# Patient Record
Sex: Male | Born: 1940 | Race: White | Hispanic: No | Marital: Single | Smoking: Never smoker
Health system: Southern US, Community
[De-identification: ages and names within clinical notes are randomized; demographics above are authoritative.]

## PROBLEM LIST (undated history)

## (undated) DIAGNOSIS — I252 Old myocardial infarction: Secondary | ICD-10-CM

## (undated) DIAGNOSIS — H409 Unspecified glaucoma: Secondary | ICD-10-CM

## (undated) DIAGNOSIS — M5136 Other intervertebral disc degeneration, lumbar region: Secondary | ICD-10-CM

## (undated) DIAGNOSIS — M545 Low back pain, unspecified: Secondary | ICD-10-CM

## (undated) DIAGNOSIS — M51369 Other intervertebral disc degeneration, lumbar region without mention of lumbar back pain or lower extremity pain: Secondary | ICD-10-CM

## (undated) DIAGNOSIS — M5030 Other cervical disc degeneration, unspecified cervical region: Secondary | ICD-10-CM

## (undated) DIAGNOSIS — H532 Diplopia: Secondary | ICD-10-CM

## (undated) DIAGNOSIS — I219 Acute myocardial infarction, unspecified: Secondary | ICD-10-CM

## (undated) DIAGNOSIS — H02409 Unspecified ptosis of unspecified eyelid: Secondary | ICD-10-CM

## (undated) DIAGNOSIS — IMO0002 Reserved for concepts with insufficient information to code with codable children: Secondary | ICD-10-CM

## (undated) DIAGNOSIS — E7800 Pure hypercholesterolemia, unspecified: Secondary | ICD-10-CM

## (undated) HISTORY — DX: Other intervertebral disc degeneration, lumbar region without mention of lumbar back pain or lower extremity pain: M51.369

## (undated) HISTORY — DX: Acute myocardial infarction, unspecified: I21.9

## (undated) HISTORY — DX: Other intervertebral disc degeneration, lumbar region: M51.36

## (undated) HISTORY — DX: Low back pain, unspecified: M54.50

## (undated) HISTORY — DX: Pure hypercholesterolemia, unspecified: E78.00

## (undated) HISTORY — DX: Reserved for concepts with insufficient information to code with codable children: IMO0002

## (undated) HISTORY — DX: Unspecified ptosis of unspecified eyelid: H02.409

## (undated) HISTORY — DX: Unspecified glaucoma: H40.9

## (undated) HISTORY — DX: Other cervical disc degeneration, unspecified cervical region: M50.30

## (undated) HISTORY — DX: Diplopia: H53.2

## (undated) HISTORY — DX: Old myocardial infarction: I25.2

## (undated) HISTORY — PX: CYSTECTOMY: SUR359

## (undated) HISTORY — DX: Low back pain: M54.5

---

## 2002-06-16 ENCOUNTER — Inpatient Hospital Stay (HOSPITAL_COMMUNITY): Admission: AD | Admit: 2002-06-16 | Discharge: 2002-06-18 | Payer: Self-pay | Admitting: Family Medicine

## 2002-07-24 ENCOUNTER — Ambulatory Visit (HOSPITAL_COMMUNITY): Admission: RE | Admit: 2002-07-24 | Discharge: 2002-07-25 | Payer: Self-pay | Admitting: Cardiology

## 2004-07-25 ENCOUNTER — Ambulatory Visit (HOSPITAL_COMMUNITY): Admission: RE | Admit: 2004-07-25 | Discharge: 2004-07-25 | Payer: Self-pay | Admitting: Family Medicine

## 2006-05-21 IMAGING — CR DG CERVICAL SPINE COMPLETE 4+V
5 series · 5 of 5 positions shown · non-contrast
Comparison: none

CLINICAL DATA: Neck pain radiating to right shoulder.
 CERVICAL SPINE ? 5 VIEW:

[w c-spine lat (1 of 3)]
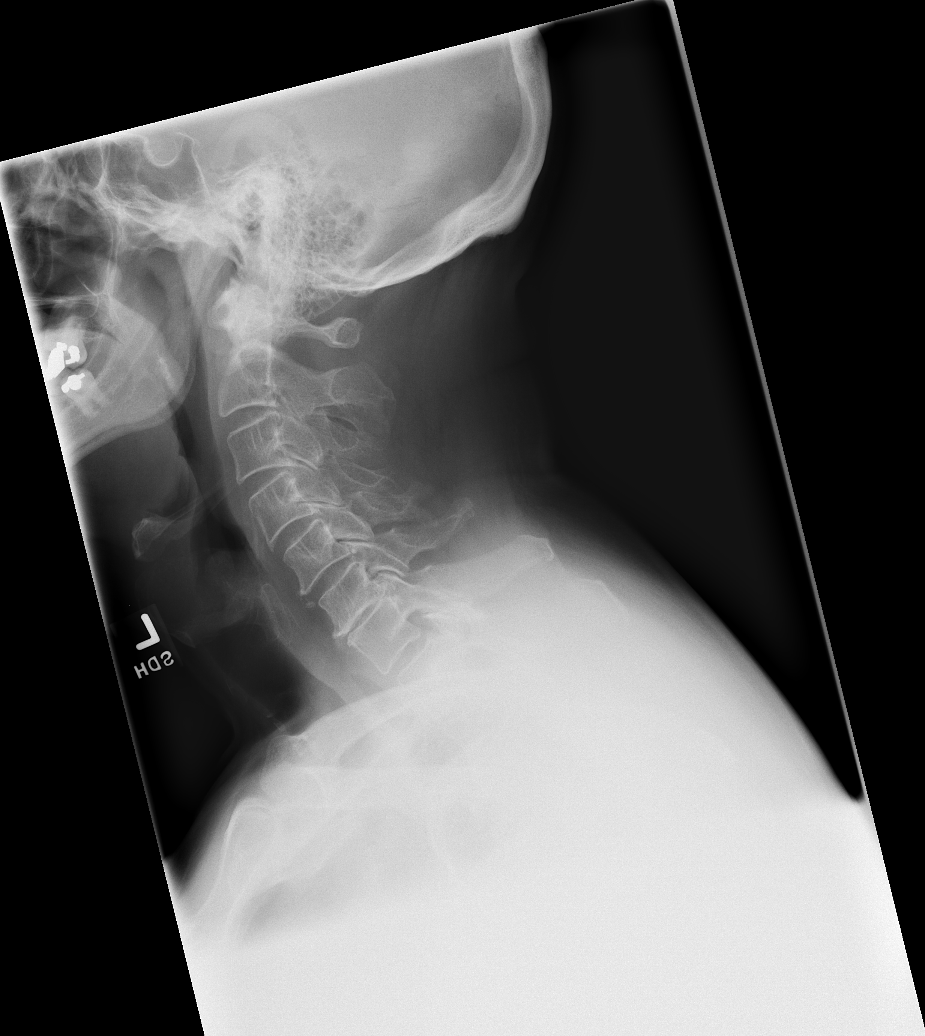

[w c-spine lat (2 of 3)]
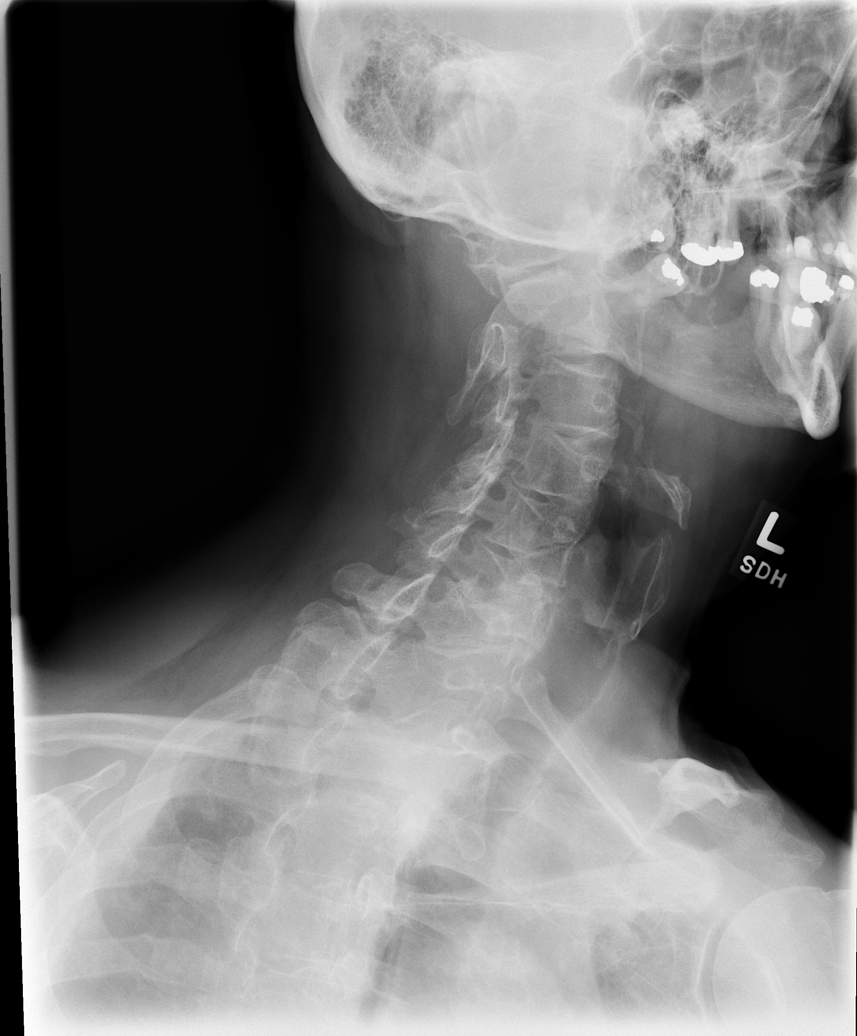

[w c-spine lat (3 of 3)]
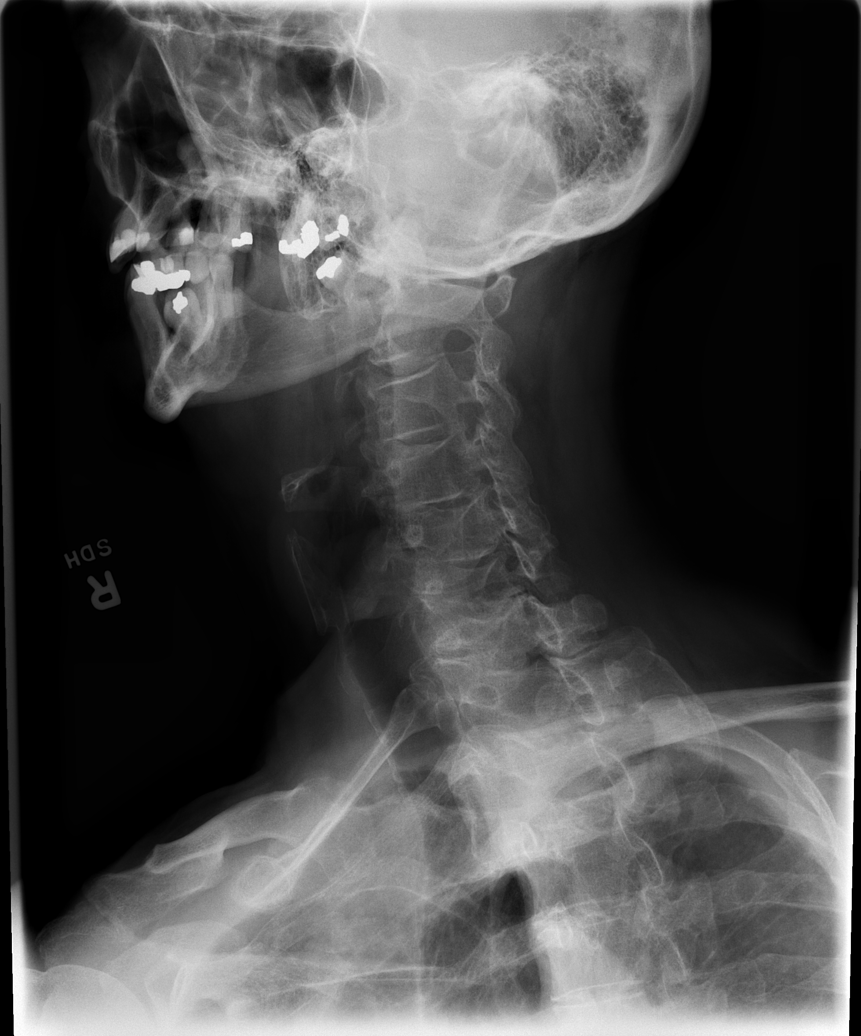

[w c-spine a.p. * (1 of 2)]
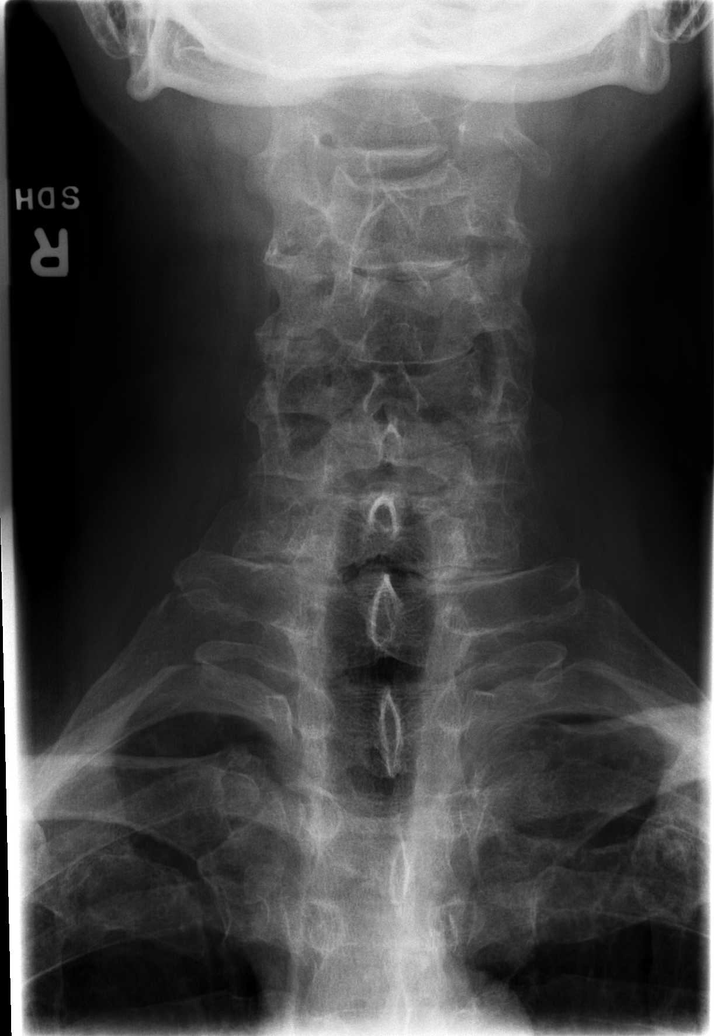

[w c-spine a.p. * (2 of 2)]
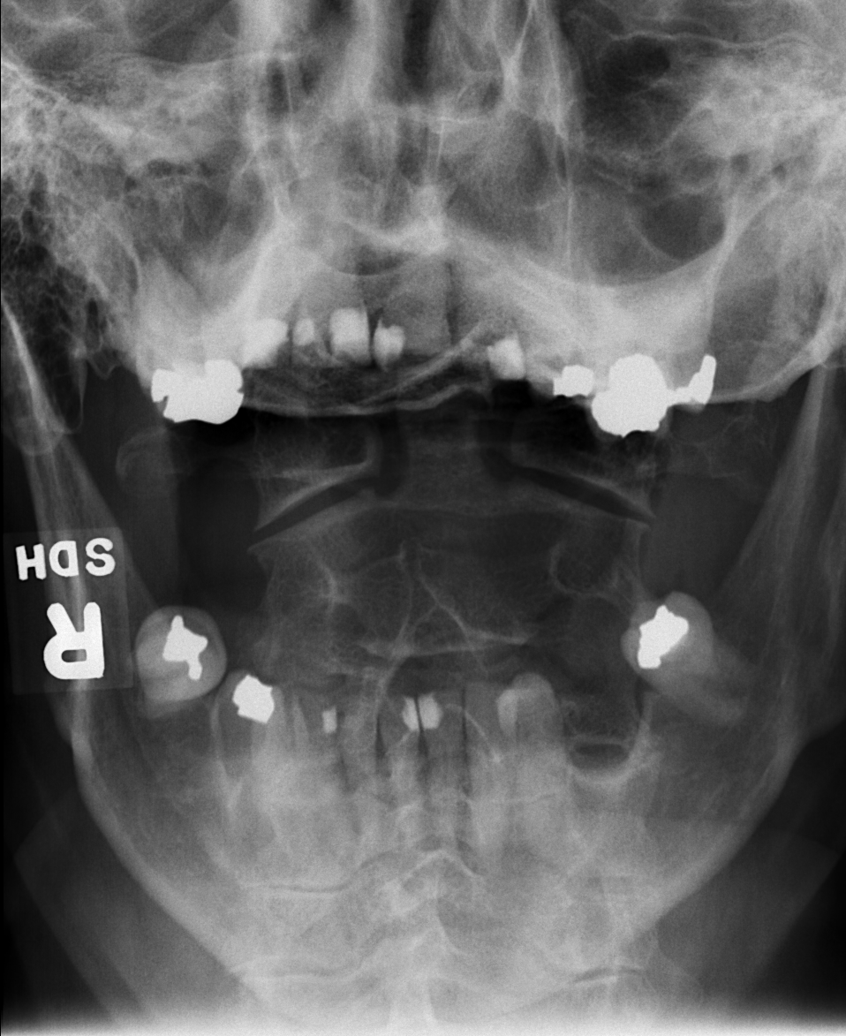

[5 of 5 positions shown; findings below may reference images not displayed]

FINDINGS: Normal cervical alignment is noted.  No evidence of fracture or prevertebral soft tissue swelling.  Degenerative disk disease and spondylosis are noted throughout the cervical spine, severe at C6-7.  Bony foraminal narrowing bilaterally is identified from C3 to C7 bilaterally.
IMPRESSION: Degenerative disk disease and spondylosis of the cervical spine, most severe at C6-7, with bony foraminal narrowing.

## 2008-06-19 ENCOUNTER — Encounter: Admission: RE | Admit: 2008-06-19 | Discharge: 2008-06-19 | Payer: Self-pay | Admitting: Family Medicine

## 2010-05-01 ENCOUNTER — Encounter: Payer: Self-pay | Admitting: Family Medicine

## 2010-08-26 NOTE — H&P (Signed)
NAMEJATHEN, [name] NO.:  [id number]   MEDICAL RECORD NO.:  [id number]                   PATIENT TYPE:  INP   LOCATION:  2037                                 FACILITY:  [facility]   PHYSICIAN:  [name], M.D.               DATE OF BIRTH:  1941/01/22   DATE OF ADMISSION:  06/16/2002  DATE OF DISCHARGE:                                HISTORY & PHYSICAL   PRIMARY CARE [name]:  Dr. Stacie Acres. Holder.   IMPRESSION:  (As dictated by [name].)  Chest pain with atypical/typical features of unstable angina pectoris, rule  out gastrointestinal versus cardiac.  Cardiac risk factors include age and  positive family history of coronary artery disease.  Unknown cholesterol  profile.  His first electrocardiogram is nonischemic (from prior medical  doctor's office).  He continues with ongoing chest discomfort.  No  significant change in discomfort after gastrointestinal cocktail at primary  medical doctor clinic, nor after serial sublingual nitrates here in  hospital.  Discomfort does not appear to be muscular nor pleuritic component  to pain.   PLAN:  (As dictated by [name].)  1. Repeat EKG.  2. Serial cardiac enzymes/EKG.  3. Aspirin daily; we will give him four babys to chew right know.  We will     start IV heparin and IV nitroglycerin.  4. If rules in, would counsel for cardiac catheterization.  The patient is     very resistant to any idea of invasive procedures.  With this in mind, we     will obtain a 2-D echo to help guide our decision/recommendations.   HISTORY OF PRESENT ILLNESS:  The patient is a very pleasant 70 year old  gentleman with positive family history of CAD.  The patient had an episode  of transient mid-substernal chest discomfort with associated shortness of  breath when walking briskly after one-quarter of a mile on Saturday.  This  was relieved with rest.  Last evening, he developed left anterior chest  discomfort after coughing after eating some rice yesterday.  The  discomfort persisted and though it waxed and waned in intensity, it never  resolved.  He has had mild nausea/chills.  No diaphoresis nor shortness of  breath.  The patient took an Alka-Seltzer Plus and Advil yesterday without  change.   PREVIOUS MEDICAL HISTORY:  Gout flare:  Denies history of diabetes,  hypertension, ulcers, cancer, nor thyroid disease.   PREVIOUS SURGICAL HISTORY:  Removal of a cyst on his hip.   SOCIAL HISTORY AND HABITS:  Tobacco/ETOH:  Negative.  He has been married  for 41 years.  He has two daughters.  He works as a Secretary/administrator at [employer].  [employer].   FAMILY HISTORY:  Father died at age 33 of CHF.  Mother died at age 29,  uncertain cause.  One brother had a  valve placed at age 30s,  a redo valve  with CABG, age 25; he died at age 77.  Two sisters alive and well.   REVIEW OF SYSTEMS:  As in HPI/previous medical history, otherwise, denies  problems with lightheadedness, dizziness, syncope nor near-syncopal  episodes.  Gets choked up after eating certain foods such as corn bread and  rice.  GI/GU:  Negative.  Minor hearing problems.  No pedal edema, orthopnea  or PND.  The patient must pace himself so as to not get winded.  He  attributes this to his weight of approximately 200 pounds and feels this  should be more like 170.  He seems more easily fatigued/short of breath with  exertion since he suffered heat exhaustion episode in 1999.   The patient has been under a lot of stress over last few weeks secondary to  birth of twin grandchildren, one of whom needed advanced medical support.  He also has had more job stress at work the last few weeks.   PHYSICAL EXAMINATION:  (As performed by [name].)  VITAL SIGNS:  Blood pressure 160/98, heart rate 104 and regular, respiratory  rate 16.  He is 5 feet 8 inches and weighs 200.8 pounds.  GENERAL:  In general, he is a well-nourished, pleasantly  conversant  gentleman appearing younger than his 61 years.  His wife is in attendance.  HEENT:  Brisk bilateral carotid upstrokes without bruit.  No significant JVD  nor thyromegaly.  CHEST:  Lung sounds clear with equal bilateral excursion.  Negative CVA  tenderness.  CARDIAC:  Regular rate and rhythm without murmur or rub.  Positive S4.  ABDOMEN:  Soft, nondistended, normoactive bowel sounds.  Negative abdominal  aortic, renal nor femoral bruit.  Nontender to applied pressure.  No masses  nor organomegaly appreciated.  EXTREMITIES:  Distal pulses intact.  Negative pedal edema.  GENITAL AND RECTAL:  Exams deferred.   LABORATORY TESTS AND DATA:  CBC, CMET, cardiac enzymes and coagulations were  drawn and are pending.   EKG from primary M.D. office revealed NSR without ischemic changes.   Quick-look 2-D echo revealed normal LV function without regional wall motion  abnormalities.  Valves okay.   ADDENDUM:  The patient had emesis and states he feels better after this.      [name], N.P.                       [name], M.D.    MES/MEDQ  D:  06/16/2002  T:  06/16/2002  Job:  [id number]

## 2012-01-15 DIAGNOSIS — H532 Diplopia: Secondary | ICD-10-CM | POA: Insufficient documentation

## 2012-01-15 DIAGNOSIS — IMO0002 Reserved for concepts with insufficient information to code with codable children: Secondary | ICD-10-CM | POA: Insufficient documentation

## 2012-01-31 DIAGNOSIS — H5000 Unspecified esotropia: Secondary | ICD-10-CM | POA: Insufficient documentation

## 2012-01-31 DIAGNOSIS — H5030 Unspecified intermittent heterotropia: Secondary | ICD-10-CM | POA: Insufficient documentation

## 2012-12-16 DIAGNOSIS — H401190 Primary open-angle glaucoma, unspecified eye, stage unspecified: Secondary | ICD-10-CM | POA: Insufficient documentation

## 2013-01-04 ENCOUNTER — Other Ambulatory Visit: Payer: Self-pay | Admitting: Cardiology

## 2013-01-04 DIAGNOSIS — Z79899 Other long term (current) drug therapy: Secondary | ICD-10-CM

## 2013-01-04 DIAGNOSIS — E782 Mixed hyperlipidemia: Secondary | ICD-10-CM

## 2013-02-07 ENCOUNTER — Other Ambulatory Visit (INDEPENDENT_AMBULATORY_CARE_PROVIDER_SITE_OTHER): Payer: Medicare Other

## 2013-02-07 DIAGNOSIS — Z79899 Other long term (current) drug therapy: Secondary | ICD-10-CM

## 2013-02-07 DIAGNOSIS — E782 Mixed hyperlipidemia: Secondary | ICD-10-CM

## 2013-02-07 LAB — ALT: ALT: 29 U/L (ref 0–53)

## 2013-02-09 LAB — NMR LIPOPROFILE WITH LIPIDS
HDL Size: 9.1 nm — ABNORMAL LOW (ref >=9.2)
HDL-C: 49 mg/dL (ref >=40)
LDL Size: 20.3 nm — ABNORMAL LOW (ref >20.5)
LP-IR Score: 44 (ref <=45)
Large HDL-P: 5.3 umol/L (ref >=4.8)
Small LDL Particle Number: 522 nmol/L (ref <=527)
VLDL Size: 43.3 nm (ref <=46.6)

## 2013-02-11 ENCOUNTER — Telehealth: Payer: Self-pay | Admitting: Pharmacist

## 2013-02-11 DIAGNOSIS — Z79899 Other long term (current) drug therapy: Secondary | ICD-10-CM

## 2013-02-11 DIAGNOSIS — E785 Hyperlipidemia, unspecified: Secondary | ICD-10-CM

## 2013-02-11 NOTE — Telephone Encounter (Signed)
RF:  CAD (MI 2004), age - LDL goal < 100, LDL-P < 1000 nmol/L Meds:  Vytorin 10/40 mg qd, fish oil 4 g/d. Patient changed from Crestor to Vytorin in past for potency reasons. Excellent panel today.    Patient notified to continue same regimen and recheck lipid / liver panel in 1 year.  Appointment made for 01/21/14.  Results mailed to patient.

## 2013-02-15 ENCOUNTER — Encounter: Payer: Self-pay | Admitting: Cardiology

## 2013-02-19 ENCOUNTER — Ambulatory Visit (INDEPENDENT_AMBULATORY_CARE_PROVIDER_SITE_OTHER): Payer: Medicare Other | Admitting: Cardiology

## 2013-02-19 ENCOUNTER — Encounter: Payer: Self-pay | Admitting: Cardiology

## 2013-02-19 VITALS — BP 158/86 | HR 69 | Ht 69.5 in | Wt 209.0 lb

## 2013-02-19 DIAGNOSIS — I208 Other forms of angina pectoris: Secondary | ICD-10-CM

## 2013-02-19 DIAGNOSIS — I252 Old myocardial infarction: Secondary | ICD-10-CM

## 2013-02-19 DIAGNOSIS — N529 Male erectile dysfunction, unspecified: Secondary | ICD-10-CM

## 2013-02-19 DIAGNOSIS — I2510 Atherosclerotic heart disease of native coronary artery without angina pectoris: Secondary | ICD-10-CM

## 2013-02-19 DIAGNOSIS — E7800 Pure hypercholesterolemia, unspecified: Secondary | ICD-10-CM

## 2013-02-19 HISTORY — DX: Old myocardial infarction: I25.2

## 2013-02-19 NOTE — Progress Notes (Signed)
[address] [place], [place]  [address] Phone: [phone] Fax:  [phone]  Date:  02/19/2013   ID:  [name], DOB 1940-11-07, MRN [id number]  PCP:  Ryan Patella, MD   History of Present Illness: [name] is a 72 y.o. male with coronary artery disease status post stent to RCA in 2004, Taxus 3.0 x 12 mm here for followup. Had terrible pain in central chest prior to stent. Initial myocardial infarction included anterolateral region with occluded diagonal. One month later he had RCA stent.   CAD-doing well, no exertional angina. Occasionally will have mild shortness of breath. Sometimes with yard work. No change in symptoms. it has been a while since last stress test.  Hyperlipidemia-on Vytorin, fish oil. Doing well. Benicol. Plant stanol. doing great  Enjoying fishing at [facility]. Sometimes feels decreased exercise tolerance. Conditioning.  Wt Readings from Last 3 Encounters:  02/19/13 209 lb (94.802 kg)     Past Medical History  Diagnosis Date  . Myocardial infarction   . Degeneration, intervertebral disc, cervical   . Disc degeneration, lumbar   . Hypercholesterolemia   . Glaucoma   . Vertical diplopia   . Ptosis   . Hypertropia right  . Low back pain   . 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 One month later, RCA PCI.    History reviewed. No pertinent past surgical history.  Current Outpatient Prescriptions  Medication Sig Dispense Refill  . aspirin 81 MG tablet Take 81 mg by mouth daily.      . dorzolamide-timolol (COSOPT) 22.3-6.8 MG/ML ophthalmic solution Place 1 drop into the right eye 2 (two) times daily.      Marland Kitchen ezetimibe-simvastatin (VYTORIN) 10-40 MG per tablet Take 1 tablet by mouth daily.      . fish oil-omega-3 fatty acids 1000 MG capsule Take 2 g by mouth daily.      . fluticasone (CUTIVATE) 0.005 % ointment Apply 1 application topically 2 (two)  times daily.      Marland Kitchen ibuprofen (ADVIL,MOTRIN) 200 MG tablet Take 200 mg by mouth every 6 (six) hours as needed.      Marland Kitchen levothyroxine (SYNTHROID, LEVOTHROID) 50 MCG tablet Take 50 mcg by mouth daily before breakfast.       No current facility-administered medications for this visit.    Allergies:   No Known Allergies  Social History:  The patient  reports that he has never smoked. He does not have any smokeless tobacco history on file. He reports that he does not drink alcohol or use illicit drugs.   ROS:  Please see the history of present illness.   Denies any fevers, chills, orthopnea, PND, syncope    PHYSICAL EXAM: VS:  BP 158/86  Pulse 69  Ht 5' 9.5" (1.765 m)  Wt 209 lb (94.802 kg)  BMI 30.43 kg/m2 Well nourished, well developed, in no acute distress HEENT: normal Neck: no JVD Cardiac:  normal S1, S2; RRR; no murmur Lungs:  clear to auscultation bilaterally, no wheezing, rhonchi or rales Abd: soft, nontender, no hepatomegaly Ext: no edema Skin: warm and dry Neuro: no focal abnormalities noted  EKG:  Normal rhythm, 69, minimal voltage criteria for LVH. No ST segment changes.     ASSESSMENT AND PLAN:  1. Old myocardial infarction-currently doing well. No issues. 2. Angina-currently stable. No recent history. Continue with secondary prevention. Prior percutaneous intervention. Encouraged conditioning. Weight loss 3. Coronary  artery disease-as described above. PCI to RCA. 4. Hyperlipidemia-no changes made. Vytorin. NMR profile reviewed. 02/07/13-LDL particle #951, small LDL 522. We will repeat in 1 year, normal lipid profile 5. Erectile dysfunction-currently being treated medically. Getting a mixture pharmacy that is working. Viagra was too expensive.  Signed, Ryan Schultz, MD [facility]  02/19/2013 10:58 AM

## 2013-02-19 NOTE — Patient Instructions (Signed)
Your physician recommends that you continue on your current medications as directed. Please refer to the Current Medication list given to you today.  Your physician wants you to follow-up in: 1 year with Dr. Skains. You will receive a reminder letter in the mail two months in advance. If you don't receive a letter, please call our office to schedule the follow-up appointment.  

## 2013-07-03 ENCOUNTER — Other Ambulatory Visit: Payer: Self-pay | Admitting: Cardiology

## 2014-01-05 ENCOUNTER — Other Ambulatory Visit: Payer: Self-pay | Admitting: Cardiology

## 2014-01-06 ENCOUNTER — Other Ambulatory Visit: Payer: Self-pay

## 2014-01-06 MED ORDER — EZETIMIBE-SIMVASTATIN 10-40 MG PO TABS: ORAL_TABLET | ORAL | Status: DC

## 2014-01-21 ENCOUNTER — Other Ambulatory Visit (INDEPENDENT_AMBULATORY_CARE_PROVIDER_SITE_OTHER): Payer: Medicare Other | Admitting: *Deleted

## 2014-01-21 DIAGNOSIS — E785 Hyperlipidemia, unspecified: Secondary | ICD-10-CM

## 2014-01-21 DIAGNOSIS — Z79899 Other long term (current) drug therapy: Secondary | ICD-10-CM

## 2014-01-21 LAB — LIPID PANEL
CHOL/HDL RATIO: 3
Cholesterol: 129 mg/dL (ref 0–200)
HDL: 38 mg/dL — AB (ref >39.00)
LDL Cholesterol: 69 mg/dL (ref 0–99)
NONHDL: 91
TRIGLYCERIDES: 110 mg/dL (ref 0.0–149.0)
VLDL: 22 mg/dL (ref 0.0–40.0)

## 2014-01-21 LAB — HEPATIC FUNCTION PANEL
ALT: 29 U/L (ref 0–53)
AST: 26 U/L (ref 0–37)
Albumin: 3.6 g/dL (ref 3.5–5.2)
Alkaline Phosphatase: 41 U/L (ref 39–117)
BILIRUBIN TOTAL: 0.8 mg/dL (ref 0.2–1.2)
Bilirubin, Direct: 0.1 mg/dL (ref 0.0–0.3)
Total Protein: 7.6 g/dL (ref 6.0–8.3)

## 2014-02-03 ENCOUNTER — Telehealth: Payer: Self-pay

## 2014-02-03 ENCOUNTER — Other Ambulatory Visit: Payer: Self-pay | Admitting: Cardiology

## 2014-02-03 ENCOUNTER — Other Ambulatory Visit: Payer: Self-pay

## 2014-02-03 MED ORDER — NITROGLYCERIN 0.4 MG SL SUBL: 0.4000 mg | SUBLINGUAL_TABLET | SUBLINGUAL | Status: DC | PRN

## 2014-02-03 NOTE — Telephone Encounter (Signed)
OK to refill - he has an appt in Nov with Dr Anne FuSkains.

## 2014-02-03 NOTE — Telephone Encounter (Signed)
I'll mail his lab results

## 2014-02-19 ENCOUNTER — Ambulatory Visit (INDEPENDENT_AMBULATORY_CARE_PROVIDER_SITE_OTHER): Payer: Medicare Other | Admitting: Cardiology

## 2014-02-19 ENCOUNTER — Encounter: Payer: Self-pay | Admitting: Cardiology

## 2014-02-19 VITALS — BP 130/82 | HR 67 | Ht 69.5 in | Wt 211.0 lb

## 2014-02-19 DIAGNOSIS — E7800 Pure hypercholesterolemia, unspecified: Secondary | ICD-10-CM

## 2014-02-19 DIAGNOSIS — N5201 Erectile dysfunction due to arterial insufficiency: Secondary | ICD-10-CM

## 2014-02-19 DIAGNOSIS — I252 Old myocardial infarction: Secondary | ICD-10-CM

## 2014-02-19 DIAGNOSIS — I208 Other forms of angina pectoris: Secondary | ICD-10-CM

## 2014-02-19 DIAGNOSIS — I2510 Atherosclerotic heart disease of native coronary artery without angina pectoris: Secondary | ICD-10-CM

## 2014-02-19 DIAGNOSIS — E669 Obesity, unspecified: Secondary | ICD-10-CM | POA: Insufficient documentation

## 2014-02-19 NOTE — Progress Notes (Signed)
[address] [place], [place]  [address] Phone: [phone]([phone] Fax:  [phone](336) [phone]  Date:  02/19/2014   ID:  [name] Selner, DOB 1940/08/11, MRN [id number]  PCP:  Lolita PatellaEADE,ROBERT ALEXANDER, MD   History of Present Illness: [name] Kelly is a 73 y.o. male with coronary artery disease status post stent to RCA in 2004, Taxus 3.0 x 12 mm here for followup. Had terrible pain in central chest prior to stent. Initial myocardial infarction included anterolateral region with occluded diagonal. One month later he had RCA stent.   CAD-doing well, no exertional angina. Occasionally will have mild shortness of breath. Sometimes with yard work. No change in symptoms. it has been a while since last stress test.  Hyperlipidemia-on Vytorin, fish oil. Doing well. Benicol. Plant stanol. doing great.  Dizziness in AM. Room spinning. BP normal. Goes away in 24 hr. Laying back makes it worse.   [place]. Sometimes feels decreased exercise tolerance. Conditioning. Was frustrated previously with our process at the front desk, lab work. We discussed.  Wt Readings from Last 3 Encounters:  02/19/14 211 lb (95.709 kg)  02/19/13 209 lb (94.802 kg)     Past Medical History  Diagnosis Date  . Myocardial infarction   . Degeneration, intervertebral disc, cervical   . Disc degeneration, lumbar   . Hypercholesterolemia   . Glaucoma   . Vertical diplopia   . Ptosis   . Hypertropia right  . Low back pain   . 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 One month later, RCA PCI.    No past surgical history on file.  Current Outpatient Prescriptions  Medication Sig Dispense Refill  . aspirin 81 MG tablet Take 81 mg by mouth daily.    . dorzolamide-timolol (COSOPT) 22.3-6.8 MG/ML ophthalmic solution Place 1 drop into the right eye 2 (two) times daily.    Marland Kitchen. ezetimibe-simvastatin (VYTORIN) 10-40 MG per tablet TAKE  ONE (1) TABLET BY MOUTH EVERY DAY 30 tablet 5  . fish oil-omega-3 fatty acids 1000 MG capsule Take 2 g by mouth daily.    . fluticasone (CUTIVATE) 0.005 % ointment Apply 1 application topically 2 (two) times daily.    Marland Kitchen. ibuprofen (ADVIL,MOTRIN) 200 MG tablet Take 200 mg by mouth every 6 (six) hours as needed.    Marland Kitchen. levothyroxine (SYNTHROID, LEVOTHROID) 50 MCG tablet Take 50 mcg by mouth daily before breakfast.    . LUMIGAN 0.01 % SOLN   4  . nitroGLYCERIN (NITROSTAT) 0.4 MG SL tablet Place 1 tablet (0.4 mg total) under the tongue every 5 (five) minutes as needed for chest pain. 90 tablet 0   No current facility-administered medications for this visit.    Allergies:   No Known Allergies  Social History:  The patient  reports that he has never smoked. He does not have any smokeless tobacco history on file. He reports that he does not drink alcohol or use illicit drugs.   ROS:  Please see the history of present illness.   Denies any fevers, chills, orthopnea, PND, syncope    PHYSICAL EXAM: VS:  BP 130/82 mmHg  Pulse 67  Ht 5' 9.5" (1.765 m)  Wt 211 lb (95.709 kg)  BMI 30.72 kg/m2 Well nourished, well developed, in no acute distress HEENT: normal Neck: no JVD Cardiac:  normal S1, S2; RRR; no murmur Lungs:  clear to auscultation bilaterally, no wheezing, rhonchi or rales Abd:  soft, nontender, no hepatomegaly Ext: no edema Skin: warm and dry Neuro: no focal abnormalities noted  EKG:  02/19/14-sinus rhythm, 67 bpm with no other abnormalities. Previously-Normal rhythm, 69, minimal voltage criteria for LVH. No ST segment changes.  Labs:01/21/14- LDL 69   ASSESSMENT AND PLAN:  1. Old myocardial infarction-currently doing well. No issues. 2. Angina-currently stable. No recent history. Continue with secondary prevention. Prior percutaneous intervention. Encouraged conditioning. Weight loss. No changes.  3. Coronary artery disease-as described above. PCI to RCA. 4. Hyperlipidemia-no  changes made. Vytorin. NMR profile reviewed. 02/07/13-LDL particle #951, small LDL 522. We will repeat in 1 year, normal lipid profile. Excellent control with Vytorin. This is the first year however he is in the donut hole. He is frustrated with this. Last LDL 69. Excellent. 5. Erectile dysfunction-currently being treated medically. Getting a mixture pharmacy that is working. Viagra was too expensive. 6. obesity-continue to work on weight loss.  Signed, Donato SchultzMark Veldon Wager, MD [facility]  02/19/2014 9:36 AM

## 2014-02-19 NOTE — Patient Instructions (Addendum)
Your physician wants you to follow-up in: 12 months with Dr. Algis LimingSkains You will receive a reminder letter in the mail two months in advance. If you don't receive a letter, please call our office to schedule the follow-up appointment.  Your physician recommends that you return for lab work prior to apt. Call office prior to appointment  Will need fasting lipid and liver

## 2014-07-17 ENCOUNTER — Other Ambulatory Visit: Payer: Self-pay | Admitting: Cardiology

## 2015-01-19 ENCOUNTER — Other Ambulatory Visit: Payer: Self-pay | Admitting: Cardiology

## 2015-03-19 ENCOUNTER — Telehealth: Payer: Self-pay | Admitting: Cardiology

## 2015-03-19 NOTE — Telephone Encounter (Signed)
Returned call to patient Wanting lab results Not released by MD yet.

## 2015-03-19 NOTE — Telephone Encounter (Signed)
Follow Up  Pt called. Please place orders.

## 2015-03-22 ENCOUNTER — Other Ambulatory Visit (INDEPENDENT_AMBULATORY_CARE_PROVIDER_SITE_OTHER): Payer: Medicare Other | Admitting: *Deleted

## 2015-03-22 DIAGNOSIS — E7800 Pure hypercholesterolemia, unspecified: Secondary | ICD-10-CM | POA: Diagnosis not present

## 2015-03-22 LAB — LIPID PANEL
CHOL/HDL RATIO: 2.9 ratio (ref <=5.0)
CHOLESTEROL: 126 mg/dL (ref 125–200)
HDL: 43 mg/dL (ref >=40)
LDL CALC: 66 mg/dL (ref <130)
TRIGLYCERIDES: 87 mg/dL (ref <150)
VLDL: 17 mg/dL (ref <30)

## 2015-03-22 LAB — HEPATIC FUNCTION PANEL
ALBUMIN: 4 g/dL (ref 3.6–5.1)
ALT: 26 U/L (ref 9–46)
AST: 27 U/L (ref 10–35)
Alkaline Phosphatase: 41 U/L (ref 40–115)
Bilirubin, Direct: 0.1 mg/dL (ref <=0.2)
Indirect Bilirubin: 0.3 mg/dL (ref 0.2–1.2)
TOTAL PROTEIN: 7.2 g/dL (ref 6.1–8.1)
Total Bilirubin: 0.4 mg/dL (ref 0.2–1.2)

## 2015-03-23 ENCOUNTER — Other Ambulatory Visit: Payer: Self-pay | Admitting: Cardiology

## 2015-03-24 ENCOUNTER — Ambulatory Visit: Payer: Self-pay | Admitting: Cardiology

## 2015-04-06 ENCOUNTER — Telehealth: Payer: Self-pay

## 2015-04-06 NOTE — Telephone Encounter (Signed)
Staff message received that patient did not like the procedure for having labs drawn.  Writer contacted patient who verbalized discontent with "[facility] swallowing up everything."  He liked the process at [facility] better.  Writer allowed patient to express his frustration, which he did about various issues.   Pt also verbalized concerns about the parking situation at the hospital, and a family friend who may no longer have a job.  Writer listened, and thanked patient for his input.   Jim Likeeri Suits [facility] RN CCM

## 2015-04-22 ENCOUNTER — Ambulatory Visit (INDEPENDENT_AMBULATORY_CARE_PROVIDER_SITE_OTHER): Payer: Medicare Other | Admitting: Cardiology

## 2015-04-22 ENCOUNTER — Encounter: Payer: Self-pay | Admitting: Cardiology

## 2015-04-22 VITALS — BP 130/70 | HR 73 | Ht 69.0 in | Wt 217.8 lb

## 2015-04-22 DIAGNOSIS — I252 Old myocardial infarction: Secondary | ICD-10-CM | POA: Diagnosis not present

## 2015-04-22 DIAGNOSIS — Z79899 Other long term (current) drug therapy: Secondary | ICD-10-CM

## 2015-04-22 DIAGNOSIS — E7800 Pure hypercholesterolemia, unspecified: Secondary | ICD-10-CM | POA: Diagnosis not present

## 2015-04-22 DIAGNOSIS — I208 Other forms of angina pectoris: Secondary | ICD-10-CM | POA: Diagnosis not present

## 2015-04-22 DIAGNOSIS — I2510 Atherosclerotic heart disease of native coronary artery without angina pectoris: Secondary | ICD-10-CM

## 2015-04-22 MED ORDER — ATORVASTATIN CALCIUM 40 MG PO TABS: 40.0000 mg | ORAL_TABLET | Freq: Every day | ORAL | Status: DC

## 2015-04-22 NOTE — Progress Notes (Signed)
[address] [place], [place]  [address] Phone: [phone](336) [phone] Fax:  [phone](336) [phone]  Date:  04/22/2015   ID:  [name], DOB January 15, 1941, MRN [id number]  PCP:  Ryan PatellaEADE,Ryan ALEXANDER, MD   History of Present Illness: [name] is a 75 y.o. male with coronary artery disease status post stent to RCA in 2004, Taxus 3.0 x 12 mm here for followup. Had terrible pain in central chest prior to stent. Initial myocardial infarction included anterolateral region with occluded diagonal. One month later he had RCA stent.   CAD-doing well, no exertional angina. Occasionally will have mild shortness of breath. Sometimes with yard work. No change in symptoms. it has been a while since last stress test.  Hyperlipidemia-states that he is feeling sluggish on Vytorin, fish oil. Prior Benicol. Plant stanol. he states that he is talked to a lot of his friends who have had trouble with Vytorin. He would like to switch. Generic.   [place]. Sometimes feels decreased exercise tolerance. Conditioning.   Was frustrated previously with our process at the front desk, lab work. We discussed. He was also frustrated previously with the lab processes. [name]. He liked it at [facility] much better he states.   Wt Readings from Last 3 Encounters:  04/22/15 217 lb 12.8 oz (98.793 kg)  02/19/14 211 lb (95.709 kg)  02/19/13 209 lb (94.802 kg)     Past Medical History  Diagnosis Date  . Myocardial infarction (HCC)   . Degeneration, intervertebral disc, cervical   . Disc degeneration, lumbar   . Hypercholesterolemia   . Glaucoma   . Vertical diplopia   . Ptosis   . Hypertropia right  . Low back pain   . 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 One month later, RCA PCI.    No past surgical history on file.  Current Outpatient Prescriptions  Medication Sig Dispense Refill  . aspirin 81 MG tablet Take 81  mg by mouth daily.    . dorzolamide (TRUSOPT) 2 % ophthalmic solution Place 1 drop into both eyes 3 times daily.    . dorzolamide-timolol (COSOPT) 22.3-6.8 MG/ML ophthalmic solution Place 1 drop into the right eye 2 (two) times daily.    . fish oil-omega-3 fatty acids 1000 MG capsule Take 2 g by mouth daily.    . fluticasone (CUTIVATE) 0.005 % ointment Apply 1 application topically 2 (two) times daily.    Marland Kitchen. ibuprofen (ADVIL,MOTRIN) 200 MG tablet Take 200 mg by mouth every 6 (six) hours as needed.    . latanoprost (XALATAN) 0.005 % ophthalmic solution Place 1 drop into the right eye nightly.    . levothyroxine (SYNTHROID, LEVOTHROID) 50 MCG tablet Take 50 mcg by mouth daily before breakfast.    . LUMIGAN 0.01 % SOLN Place 1 drop into the right eye at bedtime.   4  . nitroGLYCERIN (NITROSTAT) 0.4 MG SL tablet Place 1 tablet (0.4 mg total) under the tongue every 5 (five) minutes as needed for chest pain. 90 tablet 0  . VYTORIN 10-40 MG tablet TAKE ONE (1) TABLET BY MOUTH EVERY DAY 30 tablet 0   No current facility-administered medications for this visit.    Allergies:   No Known Allergies  Social History:  The patient  reports that he has never smoked. He does not have any smokeless tobacco history on file. He reports that he does not drink alcohol or  use illicit drugs.   ROS:  Please see the history of present illness.   Denies any fevers, chills, orthopnea, PND, syncope    PHYSICAL EXAM: VS:  BP 130/70 mmHg  Pulse 73  Ht 5\' 9"  (1.753 m)  Wt 217 lb 12.8 oz (98.793 kg)  BMI 32.15 kg/m2 Well nourished, well developed, in no acute distress HEENT: normal Neck: no JVD Cardiac:  normal S1, S2; RRR; no murmur Lungs:  clear to auscultation bilaterally, no wheezing, rhonchi or rales Abd: soft, nontender, no hepatomegaly Ext: no edema Skin: warm and dry Neuro: no focal abnormalities noted  EKG:  02/19/14-sinus rhythm, 67 bpm with no other abnormalities. Previously-Normal rhythm, 69, minimal  voltage criteria for LVH. No ST segment changes.  Labs: 03/22/15-LDL 66, ALT 26    ASSESSMENT AND PLAN:  1. Old myocardial infarction-currently doing well. No issues. 2. Angina-currently stable. No recent history. Continue with secondary prevention. Prior percutaneous intervention. Encouraged conditioning. Weight loss. No changes.  3. Coronary artery disease-as described above. PCI to RCA. 4. Hyperlipidemia-stop. Vytorin. try atorvastatin 40 mg once a day. In 6 weeks have him come back in for lab work and appointment a day after lab work. Prior NMR profile reviewed. 02/07/13-LDL particle #951, small LDL 522.  5. Erectile dysfunction-currently being treated medically. Getting a mixture pharmacy that is working. Viagra was too expensive. 6. obesity-continue to work on weight loss.  Signed, Ryan Schultz, MD [facility]  04/22/2015 3:47 PM

## 2015-04-22 NOTE — Patient Instructions (Signed)
Medication Instructions:  Please stop Vytorin and stop Atorvastatin 40 mg a day. Continue all other medications as listed.  Labwork: Please have blood work around 06/03/2015 (Lipid and ALT).  Follow-Up: Follow up in 6 weeks with Dr Anne FuSkains.  If you need a refill on your cardiac medications before your next appointment, please call your pharmacy.  Thank you for choosing [facility]!!

## 2015-05-19 ENCOUNTER — Telehealth: Payer: Self-pay | Admitting: Cardiology

## 2015-05-19 NOTE — Telephone Encounter (Signed)
Great. Atorvastatin working. Used to be on Carie Caddy, MD

## 2015-05-19 NOTE — Telephone Encounter (Signed)
New message      Calling to let Dr Anne Fu know that since medication changes---there has been a slight improvement

## 2015-05-19 NOTE — Telephone Encounter (Signed)
Calling to let Dr Anne Fu know that since medication changes---there has been a slight improvement

## 2015-06-03 ENCOUNTER — Other Ambulatory Visit (INDEPENDENT_AMBULATORY_CARE_PROVIDER_SITE_OTHER): Payer: Medicare Other | Admitting: *Deleted

## 2015-06-03 DIAGNOSIS — E7800 Pure hypercholesterolemia, unspecified: Secondary | ICD-10-CM

## 2015-06-03 DIAGNOSIS — Z79899 Other long term (current) drug therapy: Secondary | ICD-10-CM | POA: Diagnosis not present

## 2015-06-03 DIAGNOSIS — I2510 Atherosclerotic heart disease of native coronary artery without angina pectoris: Secondary | ICD-10-CM | POA: Diagnosis not present

## 2015-06-03 LAB — LIPID PANEL
CHOL/HDL RATIO: 4 ratio (ref <=5.0)
Cholesterol: 152 mg/dL (ref 125–200)
HDL: 38 mg/dL — AB (ref >=40)
LDL CALC: 100 mg/dL (ref <130)
TRIGLYCERIDES: 72 mg/dL (ref <150)
VLDL: 14 mg/dL (ref <30)

## 2015-06-03 LAB — ALT: ALT: 67 U/L — AB (ref 9–46)

## 2015-06-03 NOTE — Addendum Note (Signed)
Addended by: Tonita Phoenix on: 06/03/2015 08:18 AM   Modules accepted: Orders

## 2015-06-09 ENCOUNTER — Encounter: Payer: Self-pay | Admitting: *Deleted

## 2015-06-11 ENCOUNTER — Telehealth: Payer: Self-pay | Admitting: *Deleted

## 2015-06-11 MED ORDER — ATORVASTATIN CALCIUM 80 MG PO TABS: 80.0000 mg | ORAL_TABLET | Freq: Every day | ORAL | Status: DC

## 2015-06-11 NOTE — Telephone Encounter (Signed)
Informed pt of lab results and new orders. Pt verbalized understanding and was in agreement with this plan. While on the phone pt inquired about moving his appt because there is something he really wants to do on 3/6. Offered pt next available on 3/21 at 8:45AM. Pt in agreement to come on 3/21.

## 2015-06-11 NOTE — Telephone Encounter (Signed)
-----   Message from Jake BatheMark C Skains, MD sent at 06/07/2015 12:02 PM EST ----- Increase atorvastatin to 80mg . Let's try to get LDL <100 Donato SchultzSKAINS, MARK, MD

## 2015-06-14 ENCOUNTER — Ambulatory Visit: Payer: Medicare Other | Admitting: Cardiology

## 2015-06-29 ENCOUNTER — Encounter: Payer: Self-pay | Admitting: Physician Assistant

## 2015-06-29 ENCOUNTER — Ambulatory Visit (INDEPENDENT_AMBULATORY_CARE_PROVIDER_SITE_OTHER): Payer: Medicare Other | Admitting: Physician Assistant

## 2015-06-29 ENCOUNTER — Ambulatory Visit: Payer: Medicare Other | Admitting: Cardiology

## 2015-06-29 VITALS — BP 120/64 | HR 88 | Ht 69.0 in | Wt 212.0 lb

## 2015-06-29 DIAGNOSIS — I2510 Atherosclerotic heart disease of native coronary artery without angina pectoris: Secondary | ICD-10-CM

## 2015-06-29 DIAGNOSIS — E785 Hyperlipidemia, unspecified: Secondary | ICD-10-CM

## 2015-06-29 DIAGNOSIS — E7800 Pure hypercholesterolemia, unspecified: Secondary | ICD-10-CM

## 2015-06-29 MED ORDER — ATORVASTATIN CALCIUM 80 MG PO TABS: 40.0000 mg | ORAL_TABLET | Freq: Every day | ORAL | Status: DC

## 2015-06-29 NOTE — Patient Instructions (Addendum)
Medication Instructions: Ryan FinlayBryan Hager, PA-C, has recommended making the following medication changes: 1. DECREASE Atorvastatin (Lipitor) to 40 mg - take 0.5 tablet by mouth daily  Labwork: Your physician recommends that you return for lab work in 3 months - FASTING (lipid panel). Call back to schedule a lab appointment.  Testing/Procedures: NONE  Follow-up: Ryan GrieveBryan recommends that you schedule a follow-up appointment in January 2018 with Dr [name].  If you need a refill on your cardiac medications before your next appointment, please call your pharmacy.

## 2015-06-29 NOTE — Progress Notes (Signed)
Patient ID: [name], male   DOB: [birth year], 75 y.o.   MRN: [id number]    Date:  06/29/2015   ID:  [name], DOB [birth year], MRN [id number]  PCP:  Lolita PatellaEADE,ROBERT ALEXANDER, MD  Primary Cardiologist:  Anne FuSkains   Chief complaint: Fatigue since increased statin dose   History of Present Illness: [name] is a 75 y.o. male  with HLD, coronary artery disease status post stent to RCA in 2004, Taxus 3.0 x 12 mm here for followup. Had terrible pain in central chest prior to stent. Initial myocardial infarction included anterolateral region with occluded diagonal. One month later he had RCA stent.   The patient was switched from Vytorin to Lipitor 40 mg back in January and then in February it was  increased to 80 mg.  Since then he has been very fatigued and sluggish. +DOE and muscle pain.  Prior to increase his exertion level is much better. Now is working in the yard he has to rest times pushing a Water quality scientistwheelbarrow. He denies any chest pain or pressure.  The patient currently denies nausea, vomiting, fever, orthopnea, dizziness, PND, cough, congestion, abdominal pain, hematochezia, melena, lower extremity edema, claudication.  Wt Readings from Last 3 Encounters:  06/29/15 212 lb (96.163 kg)  04/22/15 217 lb 12.8 oz (98.793 kg)  02/19/14 211 lb (95.709 kg)     Past Medical History  Diagnosis Date  . Myocardial infarction (HCC)   . Degeneration, intervertebral disc, cervical   . Disc degeneration, lumbar   . Hypercholesterolemia   . Glaucoma   . Vertical diplopia   . Ptosis   . Hypertropia right  . Low back pain   . 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 One month later, RCA PCI.    Current Outpatient Prescriptions  Medication Sig Dispense Refill  . aspirin 81 MG tablet Take 81 mg by mouth daily.    Marland Kitchen. atorvastatin (LIPITOR) 80 MG tablet Take 0.5 tablets (40 mg total) by mouth daily. 90 tablet 3  .  dorzolamide-timolol (COSOPT) 22.3-6.8 MG/ML ophthalmic solution Place 1 drop into the right eye 2 (two) times daily.    . fish oil-omega-3 fatty acids 1000 MG capsule Take 2 g by mouth daily.    . fluticasone (CUTIVATE) 0.005 % ointment Apply 1 application topically 2 (two) times daily.    Marland Kitchen. ibuprofen (ADVIL,MOTRIN) 200 MG tablet Take 200 mg by mouth every 6 (six) hours as needed.    . latanoprost (XALATAN) 0.005 % ophthalmic solution Place 1 drop into the right eye nightly.    . levothyroxine (SYNTHROID, LEVOTHROID) 50 MCG tablet Take 50 mcg by mouth daily before breakfast.    . LUMIGAN 0.01 % SOLN Place 1 drop into the right eye at bedtime.   4  . naproxen (NAPROSYN) 500 MG tablet Take 500 mg by mouth once as needed.    . nitroGLYCERIN (NITROSTAT) 0.4 MG SL tablet Place 1 tablet (0.4 mg total) under the tongue every 5 (five) minutes as needed for chest pain. 90 tablet 0  . timolol (TIMOPTIC) 0.5 % ophthalmic solution   11   No current facility-administered medications for this visit.    Allergies:   No Known Allergies  Social History:  The patient  reports that he has never smoked. He does not have any smokeless tobacco history on file. He reports that he does not drink alcohol or use illicit drugs.   Family history:   Family History  Problem Relation Age of Onset  . Alzheimer's disease Mother   . Congestive Heart Failure Father   . Alzheimer's disease Sister   . Dementia Sister   . Coronary artery disease Brother     , valve replacement CABG    ROS:  Please see the history of present illness.  All other systems reviewed and negative.   PHYSICAL EXAM: VS:  BP 120/64 mmHg  Pulse 88  Ht  (1.753 m)  Wt 212 lb (96.163 kg)  BMI 31.29 kg/m2 Well nourished, well developed, in no acute distress HEENT: Pupils are equal round react to light accommodation extraocular movements are intact.  Neck: no JVDNo cervical lymphadenopathy. Cardiac: Regular rate and rhythm without murmurs  rubs or gallops. Lungs:  clear to auscultation bilaterally, no wheezing, rhonchi or rales Abd: soft, nontender, positive bowel sounds all quadrants, no hepatosplenomegaly Ext: no lower extremity edema.  2+ radial and dorsalis pedis pulses.  Good capillary refill Skin: warm and dry Neuro:  Grossly normal    ASSESSMENT AND PLAN:  Problem List Items Addressed This Visit    Hypercholesterolemia   Relevant Medications   atorvastatin (LIPITOR) 80 MG tablet   Coronary atherosclerosis of native coronary artery   Relevant Medications   atorvastatin (LIPITOR) 80 MG tablet    Other Visit Diagnoses    Dyslipidemia    -  Primary    Relevant Medications    atorvastatin (LIPITOR) 80 MG tablet    Other Relevant Orders    Lipid panel       Mr. Dewilde is noticed increase in his fatigue since increased Lipitor dose started. Will decrease it back to 40 mg and see how he does. Recheck his lipid panel in 3 months. Denies any chest pain and is extremely active with lots of yard work, cutting wood, etc.  FU in one year or sooner if needed.

## 2015-07-09 ENCOUNTER — Telehealth: Payer: Self-pay | Admitting: *Deleted

## 2015-07-09 ENCOUNTER — Other Ambulatory Visit: Payer: Self-pay | Admitting: Cardiology

## 2015-07-09 NOTE — Telephone Encounter (Signed)
Patient called and would like to know if it is okay for him to take Coenzyme q10 100mg . He stated that he just has not had much energy and the pharmacist recommended it. Please advise. Thanks, MI

## 2015-07-09 NOTE — Telephone Encounter (Signed)
Advised pt OK to take if he would like to try it to see if it helps with his energy level. He thanked me for calling him back and for my time.

## 2015-07-21 DIAGNOSIS — H401122 Primary open-angle glaucoma, left eye, moderate stage: Secondary | ICD-10-CM | POA: Diagnosis not present

## 2015-07-26 DIAGNOSIS — H401113 Primary open-angle glaucoma, right eye, severe stage: Secondary | ICD-10-CM | POA: Diagnosis not present

## 2015-07-26 DIAGNOSIS — H401122 Primary open-angle glaucoma, left eye, moderate stage: Secondary | ICD-10-CM | POA: Diagnosis not present

## 2015-09-20 DIAGNOSIS — H401113 Primary open-angle glaucoma, right eye, severe stage: Secondary | ICD-10-CM | POA: Diagnosis not present

## 2015-09-20 DIAGNOSIS — H401122 Primary open-angle glaucoma, left eye, moderate stage: Secondary | ICD-10-CM | POA: Diagnosis not present

## 2015-09-28 ENCOUNTER — Telehealth: Payer: Self-pay | Admitting: Cardiology

## 2015-09-28 DIAGNOSIS — E7800 Pure hypercholesterolemia, unspecified: Secondary | ICD-10-CM

## 2015-09-28 NOTE — Telephone Encounter (Signed)
Pt is aware that is okay to come for blood work (lipid panel) tomorrow Lipitor medication dose was decreased to half dose on 06/29/15 by Dwana MelenaBryan W. Hager PA and to have Lipid panel in 3 months. Order and appointment placed in EPIC. Pt is aware.

## 2015-09-28 NOTE — Telephone Encounter (Signed)
ENw MEssage  Pt planning on coming in fasting for blood work tomorrow- 6/21- no order in system. Please advise.

## 2015-09-29 ENCOUNTER — Other Ambulatory Visit (INDEPENDENT_AMBULATORY_CARE_PROVIDER_SITE_OTHER): Payer: Medicare Other | Admitting: *Deleted

## 2015-09-29 DIAGNOSIS — E7800 Pure hypercholesterolemia, unspecified: Secondary | ICD-10-CM | POA: Diagnosis not present

## 2015-09-29 LAB — LIPID PANEL
Cholesterol: 157 mg/dL (ref 125–200)
HDL: 47 mg/dL (ref >=40)
LDL CALC: 93 mg/dL (ref <130)
TRIGLYCERIDES: 85 mg/dL (ref <150)
Total CHOL/HDL Ratio: 3.3 Ratio (ref <=5.0)
VLDL: 17 mg/dL (ref <30)

## 2016-01-24 DIAGNOSIS — H401113 Primary open-angle glaucoma, right eye, severe stage: Secondary | ICD-10-CM | POA: Diagnosis not present

## 2016-01-24 DIAGNOSIS — H401122 Primary open-angle glaucoma, left eye, moderate stage: Secondary | ICD-10-CM | POA: Diagnosis not present

## 2016-01-26 DIAGNOSIS — N529 Male erectile dysfunction, unspecified: Secondary | ICD-10-CM | POA: Diagnosis not present

## 2016-01-26 DIAGNOSIS — Z1389 Encounter for screening for other disorder: Secondary | ICD-10-CM | POA: Diagnosis not present

## 2016-01-26 DIAGNOSIS — I2510 Atherosclerotic heart disease of native coronary artery without angina pectoris: Secondary | ICD-10-CM | POA: Diagnosis not present

## 2016-01-26 DIAGNOSIS — Z23 Encounter for immunization: Secondary | ICD-10-CM | POA: Diagnosis not present

## 2016-01-26 DIAGNOSIS — E7800 Pure hypercholesterolemia, unspecified: Secondary | ICD-10-CM | POA: Diagnosis not present

## 2016-01-26 DIAGNOSIS — K219 Gastro-esophageal reflux disease without esophagitis: Secondary | ICD-10-CM | POA: Diagnosis not present

## 2016-01-26 DIAGNOSIS — E039 Hypothyroidism, unspecified: Secondary | ICD-10-CM | POA: Diagnosis not present

## 2016-01-26 DIAGNOSIS — Z0000 Encounter for general adult medical examination without abnormal findings: Secondary | ICD-10-CM | POA: Diagnosis not present

## 2016-01-26 DIAGNOSIS — Z9861 Coronary angioplasty status: Secondary | ICD-10-CM | POA: Diagnosis not present

## 2016-03-27 DIAGNOSIS — H401113 Primary open-angle glaucoma, right eye, severe stage: Secondary | ICD-10-CM | POA: Diagnosis not present

## 2016-03-27 DIAGNOSIS — H401122 Primary open-angle glaucoma, left eye, moderate stage: Secondary | ICD-10-CM | POA: Diagnosis not present

## 2016-04-21 DIAGNOSIS — L301 Dyshidrosis [pompholyx]: Secondary | ICD-10-CM | POA: Diagnosis not present

## 2016-04-21 DIAGNOSIS — D485 Neoplasm of uncertain behavior of skin: Secondary | ICD-10-CM | POA: Diagnosis not present

## 2016-04-21 DIAGNOSIS — L821 Other seborrheic keratosis: Secondary | ICD-10-CM | POA: Diagnosis not present

## 2016-04-21 DIAGNOSIS — D225 Melanocytic nevi of trunk: Secondary | ICD-10-CM | POA: Diagnosis not present

## 2016-04-26 ENCOUNTER — Ambulatory Visit: Payer: Medicare Other | Admitting: Cardiology

## 2016-05-01 DIAGNOSIS — H401122 Primary open-angle glaucoma, left eye, moderate stage: Secondary | ICD-10-CM | POA: Diagnosis not present

## 2016-05-01 DIAGNOSIS — H401113 Primary open-angle glaucoma, right eye, severe stage: Secondary | ICD-10-CM | POA: Diagnosis not present

## 2016-05-05 DIAGNOSIS — L821 Other seborrheic keratosis: Secondary | ICD-10-CM | POA: Diagnosis not present

## 2016-05-05 DIAGNOSIS — D225 Melanocytic nevi of trunk: Secondary | ICD-10-CM | POA: Diagnosis not present

## 2016-05-05 DIAGNOSIS — D485 Neoplasm of uncertain behavior of skin: Secondary | ICD-10-CM | POA: Diagnosis not present

## 2016-05-08 DIAGNOSIS — H401113 Primary open-angle glaucoma, right eye, severe stage: Secondary | ICD-10-CM | POA: Diagnosis not present

## 2016-05-08 DIAGNOSIS — H401122 Primary open-angle glaucoma, left eye, moderate stage: Secondary | ICD-10-CM | POA: Diagnosis not present

## 2016-05-16 ENCOUNTER — Other Ambulatory Visit: Payer: Self-pay | Admitting: Cardiology

## 2016-05-17 ENCOUNTER — Encounter (INDEPENDENT_AMBULATORY_CARE_PROVIDER_SITE_OTHER): Payer: Self-pay

## 2016-05-17 ENCOUNTER — Encounter: Payer: Self-pay | Admitting: Cardiology

## 2016-05-17 ENCOUNTER — Ambulatory Visit (INDEPENDENT_AMBULATORY_CARE_PROVIDER_SITE_OTHER): Payer: Medicare Other | Admitting: Cardiology

## 2016-05-17 VITALS — BP 128/82 | HR 73 | Ht 68.0 in | Wt 213.1 lb

## 2016-05-17 DIAGNOSIS — I208 Other forms of angina pectoris: Secondary | ICD-10-CM

## 2016-05-17 DIAGNOSIS — I252 Old myocardial infarction: Secondary | ICD-10-CM | POA: Diagnosis not present

## 2016-05-17 DIAGNOSIS — E7800 Pure hypercholesterolemia, unspecified: Secondary | ICD-10-CM | POA: Diagnosis not present

## 2016-05-17 NOTE — Patient Instructions (Signed)
Medication Instructions:  Your physician recommends that you continue on your current medications as directed. Please refer to the Current Medication list given to you today.     Labwork: Your physician recommends that you return for lab work tomorrow.  This will be fasting.  The lab opens at 7:30 AM   Testing/Procedures: none  Follow-Up: Your physician wants you to follow-up in: 12 months.   You will receive a reminder letter in the mail two months in advance. If you don't receive a letter, please call our office to schedule the follow-up appointment.   Any Other Special Instructions Will Be Listed Below (If Applicable).     If you need a refill on your cardiac medications before your next appointment, please call your pharmacy.

## 2016-05-17 NOTE — Progress Notes (Signed)
[address] [place], [place]  [address] Phone: [phone] Fax:  [phone](336) [phone]  Date:  05/17/2016   ID:  [name], DOB January 05, 1941, MRN [id number]  PCP:  Lolita PatellaEADE,ROBERT ALEXANDER, MD   History of Present Illness: [name] is a 76 y.o. male with coronary artery disease status post stent to RCA in 2004, Taxus 3.0 x 12 mm here for followup. Had terrible pain in central chest prior to stent. Initial myocardial infarction included anterolateral region with occluded diagonal. One month later he had RCA stent.   CAD-doing well, no exertional angina. Occasionally will have mild shortness of breath. Sometimes with yard work. No change in symptoms. it has been a while since last stress test.  Hyperlipidemia- sluggish on Vytorin, fish oil. Prior Benicol. Plant stanol. he states that he  talked to a lot of his friends who have had trouble with Vytorin. Switched to atorvastatin 40. Prior ALT 67  [place]. Sometimes feels decreased exercise tolerance. Conditioning.    Wt Readings from Last 3 Encounters:  05/17/16 213 lb 1.9 oz (96.7 kg)  06/29/15 212 lb (96.2 kg)  04/22/15 217 lb 12.8 oz (98.8 kg)     Past Medical History:  Diagnosis Date  . Degeneration, intervertebral disc, cervical   . Disc degeneration, lumbar   . Glaucoma   . Hypercholesterolemia   . Hypertropia right  . Low back pain   . Myocardial infarction   . 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 One month later, RCA PCI.  Marland Kitchen. Ptosis   . Vertical diplopia     Past Surgical History:  Procedure Laterality Date  . CYSTECTOMY      Current Outpatient Prescriptions  Medication Sig Dispense Refill  . aspirin 81 MG tablet Take 81 mg by mouth daily.    Marland Kitchen. atorvastatin (LIPITOR) 80 MG tablet Take 0.5 tablets (40 mg total) by mouth daily. 90 tablet 3  . Coenzyme Q10 (CO Q10) 200 MG CAPS Take 200 mg by mouth daily.    .  dorzolamide-timolol (COSOPT) 22.3-6.8 MG/ML ophthalmic solution Place 1 drop into the right eye 2 (two) times daily.    . fish oil-omega-3 fatty acids 1000 MG capsule Take 2 g by mouth daily.    . fluticasone (CUTIVATE) 0.005 % ointment Apply 1 application topically 2 (two) times daily.    Marland Kitchen. ibuprofen (ADVIL,MOTRIN) 200 MG tablet Take 200 mg by mouth every 6 (six) hours as needed.    . latanoprost (XALATAN) 0.005 % ophthalmic solution Place 1 drop into the right eye nightly.    . levothyroxine (SYNTHROID, LEVOTHROID) 50 MCG tablet Take 50 mcg by mouth daily before breakfast.    . LUMIGAN 0.01 % SOLN Place 1 drop into the right eye at bedtime.   4  . naproxen (NAPROSYN) 500 MG tablet Take 500 mg by mouth once as needed.    Marland Kitchen. NITROSTAT 0.4 MG SL tablet PLACE 1 TABLET UNDER THE TONGUE EVERY FIVE MINUTES AS NEEDED FOR CHEST PAIN 25 tablet 5  . omeprazole (PRILOSEC) 20 MG capsule Take 20 mg by mouth daily.    . timolol (TIMOPTIC) 0.5 % ophthalmic solution   11   No current facility-administered medications for this visit.     Allergies:   No Known Allergies  Social History:  The patient  reports that he has never smoked. He has never used smokeless tobacco. He reports that he does  not drink alcohol or use drugs.   ROS:  Please see the history of present illness.   Denies any fevers, chills, orthopnea, PND, syncope    PHYSICAL EXAM: VS:  BP 128/82   Pulse 73   Ht 5\' 8"  (1.727 m)   Wt 213 lb 1.9 oz (96.7 kg)   BMI 32.40 kg/m  Well nourished, well developed, in no acute distress  HEENT: normal  Neck: no JVD  Cardiac:  normal S1, S2; RRR; no murmur  Lungs:  clear to auscultation bilaterally, no wheezing, rhonchi or rales  Abd: soft, nontender, no hepatomegaly  Ext: no edema  Skin: warm and dry -Multiple skin excoriations after dermatologic freezing Neuro: no focal abnormalities noted  EKG:  Today 05/17/16-sinus rhythm, sinus arrhythmia, heart rate 73 bpm personally viewed-prior  02/19/14-sinus rhythm, 67 bpm with no other abnormalities. Previously-Normal rhythm, 69, minimal voltage criteria for LVH. No ST segment changes.  Labs: 03/22/15-LDL 66, ALT 26    ASSESSMENT AND PLAN:  1. Old myocardial infarction-currently doing well. No issues. 2. Angina-currently stable. No recent history. Continue with secondary prevention. Prior percutaneous intervention. Encouraged conditioning. Weight loss. No changes.  3. Coronary artery disease-as described above. PCI to RCA 2004. 4. Hyperlipidemia-stopped Vytorin. On atorvastatin 40 mg once a day.Prior NMR profile reviewed. 02/07/13-LDL particle #951, small LDL 522. Last LDL 93.  5. Erectile dysfunction-currently being treated medically. Getting a mixture pharmacy that is working. Viagra was too expensive. 6. Obesity-continue to work on weight loss.  Signed, Donato Schultz, MD [facility]  05/17/2016 10:02 AM

## 2016-05-17 NOTE — Addendum Note (Signed)
Addended by: Sydnee CabalMACK, CHASITIE R on: 05/17/2016 10:18 AM   Modules accepted: Orders

## 2016-05-18 ENCOUNTER — Other Ambulatory Visit: Payer: Medicare Other | Admitting: *Deleted

## 2016-05-18 DIAGNOSIS — E7800 Pure hypercholesterolemia, unspecified: Secondary | ICD-10-CM

## 2016-05-18 LAB — LIPID PANEL
CHOL/HDL RATIO: 3.7 ratio (ref 0.0–5.0)
CHOLESTEROL TOTAL: 156 mg/dL (ref 100–199)
HDL: 42 mg/dL (ref >39)
LDL Calculated: 96 mg/dL (ref 0–99)
TRIGLYCERIDES: 92 mg/dL (ref 0–149)
VLDL Cholesterol Cal: 18 mg/dL (ref 5–40)

## 2016-05-18 LAB — ALT: ALT: 33 IU/L (ref 0–44)

## 2016-05-25 ENCOUNTER — Encounter: Payer: Self-pay | Admitting: *Deleted

## 2016-05-25 ENCOUNTER — Telehealth: Payer: Self-pay | Admitting: Cardiology

## 2016-05-25 NOTE — Telephone Encounter (Signed)
Called and reviewed results with pt who states understanding.

## 2016-05-25 NOTE — Telephone Encounter (Signed)
New message ° ° ° ° °Returning a call to the nurse to get lab results °

## 2016-06-07 DIAGNOSIS — H401113 Primary open-angle glaucoma, right eye, severe stage: Secondary | ICD-10-CM | POA: Diagnosis not present

## 2016-06-07 DIAGNOSIS — H401122 Primary open-angle glaucoma, left eye, moderate stage: Secondary | ICD-10-CM | POA: Diagnosis not present

## 2016-08-30 DIAGNOSIS — H401413 Capsular glaucoma with pseudoexfoliation of lens, right eye, severe stage: Secondary | ICD-10-CM | POA: Diagnosis not present

## 2016-08-30 DIAGNOSIS — H25812 Combined forms of age-related cataract, left eye: Secondary | ICD-10-CM | POA: Diagnosis not present

## 2016-08-30 DIAGNOSIS — H401422 Capsular glaucoma with pseudoexfoliation of lens, left eye, moderate stage: Secondary | ICD-10-CM | POA: Diagnosis not present

## 2016-10-04 DIAGNOSIS — L570 Actinic keratosis: Secondary | ICD-10-CM | POA: Diagnosis not present

## 2016-10-04 DIAGNOSIS — H401422 Capsular glaucoma with pseudoexfoliation of lens, left eye, moderate stage: Secondary | ICD-10-CM | POA: Diagnosis not present

## 2016-10-04 DIAGNOSIS — X32XXXD Exposure to sunlight, subsequent encounter: Secondary | ICD-10-CM | POA: Diagnosis not present

## 2016-10-04 DIAGNOSIS — H401413 Capsular glaucoma with pseudoexfoliation of lens, right eye, severe stage: Secondary | ICD-10-CM | POA: Diagnosis not present

## 2016-10-04 DIAGNOSIS — Z1283 Encounter for screening for malignant neoplasm of skin: Secondary | ICD-10-CM | POA: Diagnosis not present

## 2016-10-04 DIAGNOSIS — L821 Other seborrheic keratosis: Secondary | ICD-10-CM | POA: Diagnosis not present

## 2016-11-06 DIAGNOSIS — H401413 Capsular glaucoma with pseudoexfoliation of lens, right eye, severe stage: Secondary | ICD-10-CM | POA: Diagnosis not present

## 2016-11-06 DIAGNOSIS — H401422 Capsular glaucoma with pseudoexfoliation of lens, left eye, moderate stage: Secondary | ICD-10-CM | POA: Diagnosis not present

## 2016-11-16 ENCOUNTER — Telehealth: Payer: Self-pay | Admitting: Cardiology

## 2016-11-16 DIAGNOSIS — E669 Obesity, unspecified: Secondary | ICD-10-CM | POA: Diagnosis not present

## 2016-11-16 DIAGNOSIS — R0683 Snoring: Secondary | ICD-10-CM | POA: Diagnosis not present

## 2016-11-16 DIAGNOSIS — G4719 Other hypersomnia: Secondary | ICD-10-CM | POA: Diagnosis not present

## 2016-11-16 DIAGNOSIS — Z9189 Other specified personal risk factors, not elsewhere classified: Secondary | ICD-10-CM | POA: Diagnosis not present

## 2016-11-16 NOTE — Telephone Encounter (Signed)
OK to hold ASA for planned surgical procedure. He is a patient of Dr. Anne FuSkains who is out of town. I have reviewed his record. Last coronary intervention in 2004.   Ryan CarrowChristopher Holder 11/16/2016 4:00 PM

## 2016-11-16 NOTE — Telephone Encounter (Signed)
Clearance was faxed to Dr. Georgette DoverBond's office. Confirmation received that fax was successful.

## 2016-11-16 NOTE — Telephone Encounter (Signed)
New message      [facility] Pre-operative Risk Assessment    Request for surgical clearance:  1. What type of surgery is being performed? glaucoma surgery    2. When is this surgery scheduled? 8.14.2018   3. Are there any medications that need to be held prior to surgery and how long? aspirin 81 MG tablet  4. Name of physician performing surgery? Dr. Lottie DawsonBond   5. What is your office phone and fax number? [phone] / fax [phone]  Sherrilyn RistGesila C Davis 11/16/2016, 3:23 PM  _________________________________________________________________   (provider comments below)

## 2016-11-21 DIAGNOSIS — H4010X3 Unspecified open-angle glaucoma, severe stage: Secondary | ICD-10-CM | POA: Diagnosis not present

## 2016-11-21 DIAGNOSIS — H401113 Primary open-angle glaucoma, right eye, severe stage: Secondary | ICD-10-CM | POA: Diagnosis not present

## 2016-11-21 DIAGNOSIS — I2510 Atherosclerotic heart disease of native coronary artery without angina pectoris: Secondary | ICD-10-CM | POA: Diagnosis not present

## 2016-11-21 DIAGNOSIS — Z7982 Long term (current) use of aspirin: Secondary | ICD-10-CM | POA: Diagnosis not present

## 2016-11-21 DIAGNOSIS — E785 Hyperlipidemia, unspecified: Secondary | ICD-10-CM | POA: Diagnosis not present

## 2016-11-21 DIAGNOSIS — I252 Old myocardial infarction: Secondary | ICD-10-CM | POA: Diagnosis not present

## 2016-11-21 DIAGNOSIS — K219 Gastro-esophageal reflux disease without esophagitis: Secondary | ICD-10-CM | POA: Diagnosis not present

## 2016-11-21 DIAGNOSIS — M1990 Unspecified osteoarthritis, unspecified site: Secondary | ICD-10-CM | POA: Diagnosis not present

## 2016-11-21 DIAGNOSIS — E039 Hypothyroidism, unspecified: Secondary | ICD-10-CM | POA: Diagnosis not present

## 2016-11-21 DIAGNOSIS — I10 Essential (primary) hypertension: Secondary | ICD-10-CM | POA: Diagnosis not present

## 2016-11-21 DIAGNOSIS — Z955 Presence of coronary angioplasty implant and graft: Secondary | ICD-10-CM | POA: Diagnosis not present

## 2016-12-28 ENCOUNTER — Other Ambulatory Visit: Payer: Self-pay | Admitting: Cardiology

## 2017-01-29 DIAGNOSIS — Z0000 Encounter for general adult medical examination without abnormal findings: Secondary | ICD-10-CM | POA: Diagnosis not present

## 2017-01-29 DIAGNOSIS — K219 Gastro-esophageal reflux disease without esophagitis: Secondary | ICD-10-CM | POA: Diagnosis not present

## 2017-01-29 DIAGNOSIS — E7800 Pure hypercholesterolemia, unspecified: Secondary | ICD-10-CM | POA: Diagnosis not present

## 2017-01-29 DIAGNOSIS — E039 Hypothyroidism, unspecified: Secondary | ICD-10-CM | POA: Diagnosis not present

## 2017-02-20 DIAGNOSIS — D225 Melanocytic nevi of trunk: Secondary | ICD-10-CM | POA: Diagnosis not present

## 2017-02-20 DIAGNOSIS — L821 Other seborrheic keratosis: Secondary | ICD-10-CM | POA: Diagnosis not present

## 2017-02-20 DIAGNOSIS — L858 Other specified epidermal thickening: Secondary | ICD-10-CM | POA: Diagnosis not present

## 2017-02-20 DIAGNOSIS — L570 Actinic keratosis: Secondary | ICD-10-CM | POA: Diagnosis not present

## 2017-02-20 DIAGNOSIS — X32XXXD Exposure to sunlight, subsequent encounter: Secondary | ICD-10-CM | POA: Diagnosis not present

## 2017-04-24 DIAGNOSIS — L308 Other specified dermatitis: Secondary | ICD-10-CM | POA: Diagnosis not present

## 2017-04-24 DIAGNOSIS — L820 Inflamed seborrheic keratosis: Secondary | ICD-10-CM | POA: Diagnosis not present

## 2017-04-24 DIAGNOSIS — L858 Other specified epidermal thickening: Secondary | ICD-10-CM | POA: Diagnosis not present

## 2017-05-01 ENCOUNTER — Ambulatory Visit
Admission: RE | Admit: 2017-05-01 | Discharge: 2017-05-01 | Disposition: A | Discharge status: Home / Self Care | Payer: Medicare Other | Source: Ambulatory Visit | Attending: Family Medicine | Admitting: Family Medicine

## 2017-05-01 ENCOUNTER — Other Ambulatory Visit: Payer: Self-pay | Admitting: Family Medicine

## 2017-05-01 DIAGNOSIS — M542 Cervicalgia: Secondary | ICD-10-CM

## 2017-05-01 DIAGNOSIS — L299 Pruritus, unspecified: Secondary | ICD-10-CM | POA: Diagnosis not present

## 2017-05-01 DIAGNOSIS — W57XXXD Bitten or stung by nonvenomous insect and other nonvenomous arthropods, subsequent encounter: Secondary | ICD-10-CM | POA: Diagnosis not present

## 2017-05-03 ENCOUNTER — Other Ambulatory Visit: Payer: Self-pay | Admitting: Family Medicine

## 2017-05-03 DIAGNOSIS — M542 Cervicalgia: Secondary | ICD-10-CM

## 2017-05-03 DIAGNOSIS — D72829 Elevated white blood cell count, unspecified: Secondary | ICD-10-CM

## 2017-05-06 ENCOUNTER — Ambulatory Visit
Admission: RE | Admit: 2017-05-06 | Discharge: 2017-05-06 | Disposition: A | Discharge status: Home / Self Care | Payer: Medicare Other | Source: Ambulatory Visit | Attending: Family Medicine | Admitting: Family Medicine

## 2017-05-06 ENCOUNTER — Other Ambulatory Visit: Payer: Self-pay | Admitting: Family Medicine

## 2017-05-06 DIAGNOSIS — M542 Cervicalgia: Secondary | ICD-10-CM

## 2017-05-06 DIAGNOSIS — M795 Residual foreign body in soft tissue: Secondary | ICD-10-CM

## 2017-05-06 DIAGNOSIS — Z135 Encounter for screening for eye and ear disorders: Secondary | ICD-10-CM | POA: Diagnosis not present

## 2017-05-06 DIAGNOSIS — D72829 Elevated white blood cell count, unspecified: Secondary | ICD-10-CM

## 2017-05-06 DIAGNOSIS — M4802 Spinal stenosis, cervical region: Secondary | ICD-10-CM | POA: Diagnosis not present

## 2017-05-22 DIAGNOSIS — R030 Elevated blood-pressure reading, without diagnosis of hypertension: Secondary | ICD-10-CM | POA: Diagnosis not present

## 2017-05-22 DIAGNOSIS — Z6831 Body mass index (BMI) 31.0-31.9, adult: Secondary | ICD-10-CM | POA: Diagnosis not present

## 2017-05-22 DIAGNOSIS — M4802 Spinal stenosis, cervical region: Secondary | ICD-10-CM | POA: Diagnosis not present

## 2017-06-01 ENCOUNTER — Ambulatory Visit: Payer: Medicare Other | Admitting: Cardiology

## 2017-06-01 ENCOUNTER — Encounter: Payer: Self-pay | Admitting: Cardiology

## 2017-06-01 VITALS — BP 120/82 | HR 73 | Resp 95 | Ht 67.0 in | Wt 206.8 lb

## 2017-06-01 DIAGNOSIS — I252 Old myocardial infarction: Secondary | ICD-10-CM | POA: Diagnosis not present

## 2017-06-01 DIAGNOSIS — I2510 Atherosclerotic heart disease of native coronary artery without angina pectoris: Secondary | ICD-10-CM | POA: Diagnosis not present

## 2017-06-01 DIAGNOSIS — E7800 Pure hypercholesterolemia, unspecified: Secondary | ICD-10-CM

## 2017-06-01 DIAGNOSIS — I208 Other forms of angina pectoris: Secondary | ICD-10-CM

## 2017-06-01 DIAGNOSIS — E785 Hyperlipidemia, unspecified: Secondary | ICD-10-CM | POA: Diagnosis not present

## 2017-06-01 DIAGNOSIS — Z79899 Other long term (current) drug therapy: Secondary | ICD-10-CM

## 2017-06-01 MED ORDER — ROSUVASTATIN CALCIUM 20 MG PO TABS: 20.0000 mg | ORAL_TABLET | Freq: Every day | ORAL | 3 refills | Status: DC

## 2017-06-01 NOTE — Progress Notes (Signed)
[address] [place], [place]  [address] Phone: [phone](336) [phone] Fax:  [phone]([phone]  Date:  06/01/2017   ID:  Brett FairyOrville R Holder, DOB 1940-05-11, MRN [id number]  PCP:  Elias Elseeade, Robert, MD   History of Present Illness: Ryan BowlOrville R Holder is a 77 y.o. male with coronary artery disease status post stent to RCA in 2004, Taxus 3.0 x 12 mm here for followup.   Had terrible pain in central chest prior to stent. Initial myocardial infarction included anterolateral region with occluded diagonal. One month later he had RCA stent.   He has had ophthalmologic surgery.  Overall ended up doing quite well.  He is not having any significant exertional anginal symptoms.  He will have mild shortness of breath at times may be with yard work.  Previously had switched to atorvastatin 40 mg because of feeling sluggish on Vytorin.  Had tried fish oil, Benicol.LFTs have been elevated 67 at one point.  Neck pain, MRI noted. Had to hold head to get out of bed. Sometimes legs hurt. SOB improved.   [place]  Still having some low back pain.  He has a lot of friends that have trouble with Lipitor.  He wanted to see if maybe we could switch to something else.  We will try Crestor.  Paroxysmal atrial fibrillation -Xarelto, changed metoprolol to adult CT 120 program paroxysmal atrial fibrillation  Wt Readings from Last 3 Encounters:  06/01/17 206 lb 12.8 oz (93.8 kg)  05/17/16 213 lb 1.9 oz (96.7 kg)  06/29/15 212 lb (96.2 kg)     Past Medical History:  Diagnosis Date  . Degeneration, intervertebral disc, cervical   . Disc degeneration, lumbar   . Glaucoma   . Hypercholesterolemia   . Hypertropia right  . Low back pain   . Myocardial infarction (HCC)   . 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 One month later, RCA PCI.  Marland Kitchen. Ptosis   . Vertical diplopia     Past Surgical History:  Procedure  Laterality Date  . CYSTECTOMY      Current Outpatient Medications  Medication Sig Dispense Refill  . aspirin 81 MG tablet Take 81 mg by mouth daily.    Marland Kitchen. atorvastatin (LIPITOR) 40 MG tablet TAKE ONE (1) TABLET BY MOUTH EVERY DAY 30 tablet 4  . brimonidine (ALPHAGAN) 0.2 % ophthalmic solution Place 1 drop into both eyes 3 (three) times daily.    . Coenzyme Q10 (CO Q10) 200 MG CAPS Take 200 mg by mouth daily.    . dorzolamide-timolol (COSOPT) 22.3-6.8 MG/ML ophthalmic solution Place 1 drop into the right eye 2 (two) times daily.    . fexofenadine (ALLEGRA) 180 MG tablet Take 180 mg by mouth daily.    . fish oil-omega-3 fatty acids 1000 MG capsule Take 2 g by mouth daily.    Marland Kitchen. ibuprofen (ADVIL,MOTRIN) 200 MG tablet Take 200 mg by mouth every 6 (six) hours as needed.    . latanoprost (XALATAN) 0.005 % ophthalmic solution Place 1 drop into the right eye nightly.    . levothyroxine (SYNTHROID, LEVOTHROID) 50 MCG tablet Take 50 mcg by mouth daily before breakfast.    . LUMIGAN 0.01 % SOLN Place 1 drop into the right eye at bedtime.   4  . naproxen (NAPROSYN) 500 MG tablet Take 500 mg by mouth once as needed.    Marland Kitchen. NITROSTAT 0.4 MG SL  tablet PLACE 1 TABLET UNDER THE TONGUE EVERY FIVE MINUTES AS NEEDED FOR CHEST PAIN 25 tablet 5  . omeprazole (PRILOSEC) 20 MG capsule Take 20 mg by mouth daily.    . timolol (TIMOPTIC) 0.5 % ophthalmic solution   11  . rosuvastatin (CRESTOR) 20 MG tablet Take 1 tablet (20 mg total) by mouth daily. 90 tablet 3   No current facility-administered medications for this visit.     Allergies:   No Known Allergies  Social History:  The patient  reports that  has never smoked. he has never used smokeless tobacco. He reports that he does not drink alcohol or use drugs.   ROS:  Please see the history of present illness.  All other review of systems negative  PHYSICAL EXAM: VS:  BP 120/82   Pulse 73   Resp (!) 95   Ht 5\' 7"  (1.702 m)   Wt 206 lb 12.8 oz (93.8 kg)   BMI  32.39 kg/m  GEN: Well nourished, well developed, in no acute distress  HEENT: normal  Neck: no JVD, carotid bruits, or masses Cardiac: RRR; no murmurs, rubs, or gallops,no edema  Respiratory:  clear to auscultation bilaterally, normal work of breathing GI: soft, nontender, nondistended, + BS MS: no deformity or atrophy  Skin: warm and dry, no rash Neuro:  Alert and Oriented x 3, Strength and sensation are intact Psych: euthymic mood, full affect  EKG:  Today 06/01/17-sinus rhythm, no other significant abnormalities personally viewed-prior 05/17/16-sinus rhythm, sinus arrhythmia, heart rate 73 bpm personally viewed-prior 02/19/14-sinus rhythm, 67 bpm with no other abnormalities. Previously-Normal rhythm, 69, minimal voltage criteria for LVH. No ST segment changes.  Labs: 05/18/16-ALT 33, LDL 96, triglyceride 92   ASSESSMENT AND PLAN:  1. Old myocardial infarction-2004, RCA stent, diagonal occlusion.  Overall doing well, no significant anginal symptoms.. 2. Angina-currently stable.  No significant increase in symptoms.  Continue with aggressive secondary prevention. 3. Coronary artery disease-as described above. PCI to RCA 2004.  Early generation DES.  Currently on aspirin only.  Occasionally we will continue the use of clopidogrel and these early generation stents.  He has had no prior stent thrombosis. 4. Hyperlipidemia-stopped Vytorin. On atorvastatin 40 mg once a day.Prior NMR profile reviewed. 02/07/13-LDL particle #951, small LDL 522. Last LDL96.  ALT has improved to 33.  He wanted to try some different than the atorvastatin.  I will switch him over to Crestor 20 mg once a day.  We will repeat lipid profile in 3 months and ALT.  He was worried about cost.  Hopefully this will not be in a higher cost and the atorvastatin, the insurance pays for that. 5. Erectile dysfunction-currently being treated medically. Getting a mixture pharmacy that is working. Viagra was too expensive.  No  changes. 6. Obesity-continue to work on weight loss.  Continue to encourage diet, exercise. 7. 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  Multiple level foraminal stenosis.  Per primary team. 8. One year follow up  Signed, Donato Schultz, MD [facility]  06/01/2017 9:35 AM

## 2017-06-01 NOTE — Patient Instructions (Signed)
Medication Instructions:  Please start Atorvastatin and start Crestor 20 mg a day. Continue all other medications as listed.  Labwork: Please have fasting blood work in 3 months (Lipid/ALT)  Follow-Up: Follow up in 1 year with Dr. Anne FuSkains.  You will receive a letter in the mail 2 months before you are due.  Please call us when you receive this letter to schedule your follow up appointment.  If you need a refill on your cardiac medications before your next appointment, please call your pharmacy.  Thank you for choosing [facility]!!

## 2017-06-18 DIAGNOSIS — M4802 Spinal stenosis, cervical region: Secondary | ICD-10-CM | POA: Diagnosis not present

## 2017-06-18 DIAGNOSIS — I10 Essential (primary) hypertension: Secondary | ICD-10-CM | POA: Diagnosis not present

## 2017-06-18 DIAGNOSIS — Z6832 Body mass index (BMI) 32.0-32.9, adult: Secondary | ICD-10-CM | POA: Diagnosis not present

## 2017-06-18 DIAGNOSIS — H401413 Capsular glaucoma with pseudoexfoliation of lens, right eye, severe stage: Secondary | ICD-10-CM | POA: Diagnosis not present

## 2017-06-18 DIAGNOSIS — H401422 Capsular glaucoma with pseudoexfoliation of lens, left eye, moderate stage: Secondary | ICD-10-CM | POA: Diagnosis not present

## 2017-08-02 DIAGNOSIS — H401422 Capsular glaucoma with pseudoexfoliation of lens, left eye, moderate stage: Secondary | ICD-10-CM | POA: Diagnosis not present

## 2017-08-02 DIAGNOSIS — H401413 Capsular glaucoma with pseudoexfoliation of lens, right eye, severe stage: Secondary | ICD-10-CM | POA: Diagnosis not present

## 2017-08-07 DIAGNOSIS — H401413 Capsular glaucoma with pseudoexfoliation of lens, right eye, severe stage: Secondary | ICD-10-CM | POA: Diagnosis not present

## 2017-08-07 DIAGNOSIS — H401423 Capsular glaucoma with pseudoexfoliation of lens, left eye, severe stage: Secondary | ICD-10-CM | POA: Diagnosis not present

## 2017-08-31 ENCOUNTER — Other Ambulatory Visit: Payer: Medicare Other | Admitting: *Deleted

## 2017-08-31 DIAGNOSIS — E785 Hyperlipidemia, unspecified: Secondary | ICD-10-CM | POA: Diagnosis not present

## 2017-08-31 LAB — LIPID PANEL
Chol/HDL Ratio: 3.3 ratio (ref 0.0–5.0)
Cholesterol, Total: 140 mg/dL (ref 100–199)
HDL: 43 mg/dL (ref >39)
LDL Calculated: 80 mg/dL (ref 0–99)
Triglycerides: 83 mg/dL (ref 0–149)
VLDL CHOLESTEROL CAL: 17 mg/dL (ref 5–40)

## 2017-08-31 LAB — ALT: ALT: 23 IU/L (ref 0–44)

## 2017-09-10 DIAGNOSIS — H401413 Capsular glaucoma with pseudoexfoliation of lens, right eye, severe stage: Secondary | ICD-10-CM | POA: Diagnosis not present

## 2017-09-10 DIAGNOSIS — H401423 Capsular glaucoma with pseudoexfoliation of lens, left eye, severe stage: Secondary | ICD-10-CM | POA: Diagnosis not present

## 2017-10-03 DIAGNOSIS — H401413 Capsular glaucoma with pseudoexfoliation of lens, right eye, severe stage: Secondary | ICD-10-CM | POA: Diagnosis not present

## 2017-10-03 DIAGNOSIS — H401423 Capsular glaucoma with pseudoexfoliation of lens, left eye, severe stage: Secondary | ICD-10-CM | POA: Diagnosis not present

## 2017-10-05 DIAGNOSIS — H2512 Age-related nuclear cataract, left eye: Secondary | ICD-10-CM | POA: Diagnosis not present

## 2017-10-05 DIAGNOSIS — Z961 Presence of intraocular lens: Secondary | ICD-10-CM | POA: Diagnosis not present

## 2017-10-05 DIAGNOSIS — H401490 Capsular glaucoma with pseudoexfoliation of lens, unspecified eye, stage unspecified: Secondary | ICD-10-CM | POA: Diagnosis not present

## 2017-11-02 DIAGNOSIS — H2512 Age-related nuclear cataract, left eye: Secondary | ICD-10-CM | POA: Diagnosis not present

## 2017-11-20 DIAGNOSIS — Z961 Presence of intraocular lens: Secondary | ICD-10-CM | POA: Diagnosis not present

## 2017-11-20 DIAGNOSIS — E785 Hyperlipidemia, unspecified: Secondary | ICD-10-CM | POA: Diagnosis not present

## 2017-11-20 DIAGNOSIS — Z87891 Personal history of nicotine dependence: Secondary | ICD-10-CM | POA: Diagnosis not present

## 2017-11-20 DIAGNOSIS — I252 Old myocardial infarction: Secondary | ICD-10-CM | POA: Diagnosis not present

## 2017-11-20 DIAGNOSIS — H401123 Primary open-angle glaucoma, left eye, severe stage: Secondary | ICD-10-CM | POA: Diagnosis not present

## 2017-11-20 DIAGNOSIS — K219 Gastro-esophageal reflux disease without esophagitis: Secondary | ICD-10-CM | POA: Diagnosis not present

## 2017-11-20 DIAGNOSIS — H2512 Age-related nuclear cataract, left eye: Secondary | ICD-10-CM | POA: Diagnosis not present

## 2017-11-20 DIAGNOSIS — R52 Pain, unspecified: Secondary | ICD-10-CM | POA: Diagnosis not present

## 2017-11-20 DIAGNOSIS — I2510 Atherosclerotic heart disease of native coronary artery without angina pectoris: Secondary | ICD-10-CM | POA: Diagnosis not present

## 2017-11-20 DIAGNOSIS — Z955 Presence of coronary angioplasty implant and graft: Secondary | ICD-10-CM | POA: Diagnosis not present

## 2017-11-20 DIAGNOSIS — E039 Hypothyroidism, unspecified: Secondary | ICD-10-CM | POA: Diagnosis not present

## 2017-11-20 DIAGNOSIS — H401423 Capsular glaucoma with pseudoexfoliation of lens, left eye, severe stage: Secondary | ICD-10-CM | POA: Diagnosis not present

## 2017-11-20 DIAGNOSIS — H25812 Combined forms of age-related cataract, left eye: Secondary | ICD-10-CM | POA: Diagnosis not present

## 2017-11-20 DIAGNOSIS — Z791 Long term (current) use of non-steroidal anti-inflammatories (NSAID): Secondary | ICD-10-CM | POA: Diagnosis not present

## 2017-11-20 DIAGNOSIS — M1990 Unspecified osteoarthritis, unspecified site: Secondary | ICD-10-CM | POA: Diagnosis not present

## 2017-12-13 DIAGNOSIS — H401433 Capsular glaucoma with pseudoexfoliation of lens, bilateral, severe stage: Secondary | ICD-10-CM | POA: Diagnosis not present

## 2017-12-13 DIAGNOSIS — Z961 Presence of intraocular lens: Secondary | ICD-10-CM | POA: Diagnosis not present

## 2017-12-21 DIAGNOSIS — H401430 Capsular glaucoma with pseudoexfoliation of lens, bilateral, stage unspecified: Secondary | ICD-10-CM | POA: Diagnosis not present

## 2017-12-21 DIAGNOSIS — Z7952 Long term (current) use of systemic steroids: Secondary | ICD-10-CM | POA: Diagnosis not present

## 2018-02-05 DIAGNOSIS — E039 Hypothyroidism, unspecified: Secondary | ICD-10-CM | POA: Diagnosis not present

## 2018-02-05 DIAGNOSIS — E7800 Pure hypercholesterolemia, unspecified: Secondary | ICD-10-CM | POA: Diagnosis not present

## 2018-02-05 DIAGNOSIS — K219 Gastro-esophageal reflux disease without esophagitis: Secondary | ICD-10-CM | POA: Diagnosis not present

## 2018-02-05 DIAGNOSIS — Z0000 Encounter for general adult medical examination without abnormal findings: Secondary | ICD-10-CM | POA: Diagnosis not present

## 2018-03-22 DIAGNOSIS — H26492 Other secondary cataract, left eye: Secondary | ICD-10-CM | POA: Diagnosis not present

## 2018-05-13 DIAGNOSIS — H401433 Capsular glaucoma with pseudoexfoliation of lens, bilateral, severe stage: Secondary | ICD-10-CM | POA: Diagnosis not present

## 2018-05-30 ENCOUNTER — Encounter: Payer: Self-pay | Admitting: Cardiology

## 2018-05-30 ENCOUNTER — Ambulatory Visit: Payer: Medicare Other | Admitting: Cardiology

## 2018-05-30 VITALS — BP 138/82 | HR 81 | Ht 67.0 in | Wt 212.4 lb

## 2018-05-30 DIAGNOSIS — I2510 Atherosclerotic heart disease of native coronary artery without angina pectoris: Secondary | ICD-10-CM | POA: Diagnosis not present

## 2018-05-30 DIAGNOSIS — B379 Candidiasis, unspecified: Secondary | ICD-10-CM

## 2018-05-30 DIAGNOSIS — I208 Other forms of angina pectoris: Secondary | ICD-10-CM | POA: Diagnosis not present

## 2018-05-30 DIAGNOSIS — B372 Candidiasis of skin and nail: Secondary | ICD-10-CM

## 2018-05-30 DIAGNOSIS — R0789 Other chest pain: Secondary | ICD-10-CM | POA: Diagnosis not present

## 2018-05-30 LAB — LIPID PANEL
CHOL/HDL RATIO: 3.1 ratio (ref 0.0–5.0)
CHOLESTEROL TOTAL: 149 mg/dL (ref 100–199)
HDL: 48 mg/dL (ref >39)
LDL Calculated: 81 mg/dL (ref 0–99)
TRIGLYCERIDES: 102 mg/dL (ref 0–149)
VLDL Cholesterol Cal: 20 mg/dL (ref 5–40)

## 2018-05-30 LAB — ALT: ALT: 27 IU/L (ref 0–44)

## 2018-05-30 MED ORDER — KETOCONAZOLE 2 % EX CREA: TOPICAL_CREAM | Freq: Two times a day (BID) | CUTANEOUS | Status: DC

## 2018-05-30 MED ORDER — KETOCONAZOLE 2 % EX CREA: 1.0000 "application " | TOPICAL_CREAM | Freq: Two times a day (BID) | CUTANEOUS | 0 refills | Status: AC

## 2018-05-30 MED ORDER — NITROGLYCERIN 0.4 MG SL SUBL: SUBLINGUAL_TABLET | SUBLINGUAL | 5 refills | Status: DC

## 2018-05-30 MED ORDER — ROSUVASTATIN CALCIUM 20 MG PO TABS: 20.0000 mg | ORAL_TABLET | Freq: Every day | ORAL | 3 refills | Status: DC

## 2018-05-30 NOTE — Progress Notes (Addendum)
[address], [address] [place], [place]  [address] Phone: [phone] Fax:  [phone]  Date:  05/30/2018   ID:  [name], [name] 1941-01-11, MRN [id number]  PCP:  Ryan Else, MD   History of Present Illness: [name] is a 78 y.o. male with coronary artery disease status post stent to RCA in 2004, Taxus 3.0 x 12 mm here for followup.   Had terrible pain in central chest prior to stent. Initial myocardial infarction included anterolateral region with occluded diagonal. One month later he had RCA stent.   He has had ophthalmologic surgery.  Overall ended up doing quite well.  He is not having any significant exertional anginal symptoms.  He will have mild shortness of breath at times may be with yard work.  Previously had switched to atorvastatin 40 mg because of feeling sluggish on Vytorin.  Had tried fish oil, Benicol.LFTs have been elevated 67 at one point.  Neck pain, MRI noted. Had to hold head to get out of bed. Sometimes legs hurt. SOB improved.   Enjoying fishing at [facility]. [place].  Still having some low back pain.  He has a lot of friends that have trouble with Lipitor.  He wanted to see if maybe we could switch to something Holder.  We will try Crestor.  Paroxysmal atrial fibrillation -Xarelto, changed metoprolol to adult CT 120 program paroxysmal atrial fibrillation  05/30/2018-here for the follow-up of coronary artery disease and hyperlipidemia.  Had previously been switched from atorvastatin 40 over to Crestor 20 mg.  Last LDL 80 on 08/31/2017.  ALT 23.  Wt Readings from Last 3 Encounters:  05/30/18 212 lb 6.4 oz (96.3 kg)  06/01/17 206 lb 12.8 oz (93.8 kg)  05/17/16 213 lb 1.9 oz (96.7 kg)     Past Medical History:  Diagnosis Date  . Degeneration, intervertebral disc, cervical   . Disc degeneration, lumbar   . Glaucoma   . Hypercholesterolemia   . Hypertropia right  . Low back pain   . Myocardial infarction (HCC)   . 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 One month later, RCA PCI.  Marland Kitchen Ptosis   . Vertical diplopia     Past Surgical History:  Procedure Laterality Date  . CYSTECTOMY      Current Outpatient Medications  Medication Sig Dispense Refill  . aspirin 81 MG tablet Take 81 mg by mouth daily.    . Coenzyme Q10 (CO Q10) 200 MG CAPS Take 200 mg by mouth daily.    . fexofenadine (ALLEGRA) 180 MG tablet Take 180 mg by mouth daily.    . fish oil-omega-3 fatty acids 1000 MG capsule Take 2 g by mouth daily.    Marland Kitchen ibuprofen (ADVIL,MOTRIN) 200 MG tablet Take 200 mg by mouth every 6 (six) hours as needed.    Marland Kitchen levothyroxine (SYNTHROID, LEVOTHROID) 50 MCG tablet Take 50 mcg by mouth daily before breakfast.    . naproxen (NAPROSYN) 500 MG tablet Take 500 mg by mouth once as needed.    . nitroGLYCERIN (NITROSTAT) 0.4 MG SL tablet PLACE 1 TABLET UNDER THE TONGUE EVERY FIVE MINUTES AS NEEDED FOR CHEST PAIN 25 tablet 5  . omeprazole (PRILOSEC) 20 MG capsule Take 20 mg by mouth daily.    . rosuvastatin (CRESTOR) 20 MG tablet Take 1 tablet (20 mg total) by mouth daily. 90 tablet 3  . ketoconazole (NIZORAL) 2 % cream  Apply 1 application topically 2 (two) times daily. 30 g 0   No current facility-administered medications for this visit.     Allergies:   No Known Allergies  Social History:  The patient  reports that he has never smoked. He has never used smokeless tobacco. He reports that he does not drink alcohol or use drugs.   ROS:  Please see the history of present illness.  All other review of systems negative  PHYSICAL EXAM: VS:  BP 138/82   Pulse 81   Ht 5\' 7"  (1.702 m)   Wt 212 lb 6.4 oz (96.3 kg)   SpO2 98%   BMI 33.27 kg/m  GEN: Well nourished, well developed, in no acute distress  HEENT: normal  Neck: no JVD, carotid bruits, or masses Cardiac: RRR; no murmurs, rubs, or gallops,no edema  Respiratory:  clear to auscultation bilaterally, normal  work of breathing GI: soft, nontender, nondistended, + BS MS: no deformity or atrophy  Skin: warm and dry, left underneath breast intertriginous candidiasis.  No open sores Neuro:  Alert and Oriented x 3, Strength and sensation are intact Psych: euthymic mood, full affect    EKG:  Today 05/30/2018- normal sinus rhythm 81 with no other significant abnormalities personally reviewed and interpreted-prior 06/01/17-sinus rhythm, no other significant abnormalities personally viewed-prior 05/17/16-sinus rhythm, sinus arrhythmia, heart rate 73 bpm personally viewed-prior 02/19/14-sinus rhythm, 67 bpm with no other abnormalities. Previously-Normal rhythm, 69, minimal voltage criteria for LVH. No ST segment changes.   ASSESSMENT AND PLAN:  1. Old myocardial infarction/coronary artery disease-2004, RCA stent, diagonal occlusion.  Feels well, no anginal symptoms.  No chest pain fevers chills nausea vomiting syncope shortness of breath.  Continue with current aggressive secondary risk factor prevention. 2. Angina-  Continue with aggressive secondary prevention.  Doing well. 3. Coronary artery disease-as described above. PCI to RCA 2004.  Early generation DES.  Currently on aspirin only.  Occasionally we will continue the use of clopidogrel and these early generation stents.  He has had no prior stent thrombosis.  I think it is reasonable for him to continue with his current therapy as is. 4. Hyperlipidemia-stopped Vytorin.  Stopped atorvastatin 40 mg once a day. Now on Crestor 20 mg.  Prior NMR profile reviewed. 02/07/13-LDL particle #951, small LDL 522. Last LDL96.  ALT has improved to 33.  5. Erectile dysfunction-currently being treated medically. Getting a mixture pharmacy that is working. Viagra was too expensive.  No changes. 6. Obesity- continue to work on this.  His wife is motivating him to move, exercise. 7. 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  Multiple level foraminal stenosis.  Per  primary team. 8. Intertriginous candidiasis under left breast -2% ketoconazole cream twice daily.  Once rash is gone continue for 3-4 more days.  I spoke to 1 of my primary care colleagues to get their opinion on this. 9. One year follow up  Signed, Donato Schultz, MD [facility]  05/30/2018 9:20 AM

## 2018-05-30 NOTE — Addendum Note (Signed)
Addended by: Julio Sicks on: 05/30/2018 09:00 AM   Modules accepted: Orders

## 2018-05-30 NOTE — Patient Instructions (Signed)
Medication Instructions:  1) Use Nizoral (Ketoconazole) 2% to the affected area twice daily.  Once the rash has resolved, use for 3-4 more days and then stop.   If you need a refill on your cardiac medications before your next appointment, please call your pharmacy.   Lab work: Lipid and ALT today  If you have labs (blood work) drawn today and your tests are completely normal, you will receive your results only by: Marland Kitchen MyChart Message (if you have MyChart) OR . A paper copy in the mail If you have any lab test that is abnormal or we need to change your treatment, we will call you to review the results.  Testing/Procedures: None  Follow-Up: At [facility], you and your health needs are our priority.  As part of our continuing mission to provide you with exceptional heart care, we have created designated Provider Care Teams.  These Care Teams include your primary Cardiologist (physician) and Advanced Practice Providers (APPs -  Physician Assistants and Nurse Practitioners) who all work together to provide you with the care you need, when you need it. You will need a follow up appointment in 12 months.  Please call our office 2 months in advance to schedule this appointment.  You may see Dr. Anne Fu or one of the following Advanced Practice Providers on your designated Care Team:   Norma Fredrickson, NP Nada Boozer, NP . Georgie Chard, NP  Any Other Special Instructions Will Be Listed Below (If Applicable).

## 2018-05-30 NOTE — Addendum Note (Signed)
Addended by: Julio Sicks on: 05/30/2018 09:19 AM   Modules accepted: Orders

## 2018-06-10 DIAGNOSIS — L304 Erythema intertrigo: Secondary | ICD-10-CM | POA: Diagnosis not present

## 2018-06-10 DIAGNOSIS — L258 Unspecified contact dermatitis due to other agents: Secondary | ICD-10-CM | POA: Diagnosis not present

## 2018-09-16 DIAGNOSIS — H401423 Capsular glaucoma with pseudoexfoliation of lens, left eye, severe stage: Secondary | ICD-10-CM | POA: Diagnosis not present

## 2018-09-16 DIAGNOSIS — H401413 Capsular glaucoma with pseudoexfoliation of lens, right eye, severe stage: Secondary | ICD-10-CM | POA: Diagnosis not present

## 2019-01-20 DIAGNOSIS — H401423 Capsular glaucoma with pseudoexfoliation of lens, left eye, severe stage: Secondary | ICD-10-CM | POA: Diagnosis not present

## 2019-01-20 DIAGNOSIS — H401413 Capsular glaucoma with pseudoexfoliation of lens, right eye, severe stage: Secondary | ICD-10-CM | POA: Diagnosis not present

## 2019-02-09 DIAGNOSIS — R300 Dysuria: Secondary | ICD-10-CM | POA: Diagnosis not present

## 2019-02-09 DIAGNOSIS — N390 Urinary tract infection, site not specified: Secondary | ICD-10-CM | POA: Diagnosis not present

## 2019-02-09 DIAGNOSIS — R350 Frequency of micturition: Secondary | ICD-10-CM | POA: Diagnosis not present

## 2019-02-10 DIAGNOSIS — E039 Hypothyroidism, unspecified: Secondary | ICD-10-CM | POA: Diagnosis not present

## 2019-02-10 DIAGNOSIS — E7800 Pure hypercholesterolemia, unspecified: Secondary | ICD-10-CM | POA: Diagnosis not present

## 2019-02-10 DIAGNOSIS — N390 Urinary tract infection, site not specified: Secondary | ICD-10-CM | POA: Diagnosis not present

## 2019-02-11 ENCOUNTER — Emergency Department (HOSPITAL_COMMUNITY): Payer: Medicare Other

## 2019-02-11 ENCOUNTER — Emergency Department (HOSPITAL_COMMUNITY)
Admission: EM | Admit: 2019-02-11 | Discharge: 2019-02-11 | Disposition: A | Discharge status: Home / Self Care | Payer: Medicare Other | Attending: Emergency Medicine | Admitting: Emergency Medicine

## 2019-02-11 ENCOUNTER — Other Ambulatory Visit: Payer: Self-pay

## 2019-02-11 ENCOUNTER — Encounter (HOSPITAL_COMMUNITY): Payer: Self-pay | Admitting: Emergency Medicine

## 2019-02-11 DIAGNOSIS — R300 Dysuria: Secondary | ICD-10-CM | POA: Diagnosis present

## 2019-02-11 DIAGNOSIS — Z7982 Long term (current) use of aspirin: Secondary | ICD-10-CM | POA: Insufficient documentation

## 2019-02-11 DIAGNOSIS — D72829 Elevated white blood cell count, unspecified: Secondary | ICD-10-CM | POA: Diagnosis not present

## 2019-02-11 DIAGNOSIS — R509 Fever, unspecified: Secondary | ICD-10-CM | POA: Diagnosis not present

## 2019-02-11 DIAGNOSIS — K4020 Bilateral inguinal hernia, without obstruction or gangrene, not specified as recurrent: Secondary | ICD-10-CM | POA: Diagnosis not present

## 2019-02-11 DIAGNOSIS — N3001 Acute cystitis with hematuria: Secondary | ICD-10-CM | POA: Diagnosis not present

## 2019-02-11 DIAGNOSIS — Z79899 Other long term (current) drug therapy: Secondary | ICD-10-CM | POA: Insufficient documentation

## 2019-02-11 DIAGNOSIS — I2510 Atherosclerotic heart disease of native coronary artery without angina pectoris: Secondary | ICD-10-CM | POA: Insufficient documentation

## 2019-02-11 DIAGNOSIS — R1030 Lower abdominal pain, unspecified: Secondary | ICD-10-CM | POA: Diagnosis not present

## 2019-02-11 LAB — URINALYSIS, ROUTINE W REFLEX MICROSCOPIC
Bacteria, UA: NONE SEEN
Bilirubin Urine: NEGATIVE
Glucose, UA: NEGATIVE mg/dL
Ketones, ur: NEGATIVE mg/dL
Nitrite: NEGATIVE
Protein, ur: 30 mg/dL — AB
Specific Gravity, Urine: 1.011 (ref 1.005–1.030)
pH: 7 (ref 5.0–8.0)

## 2019-02-11 LAB — CBC
HCT: 38.5 % — ABNORMAL LOW (ref 39.0–52.0)
HCT: 38.7 % — ABNORMAL LOW (ref 39.0–52.0)
Hemoglobin: 12.3 g/dL — ABNORMAL LOW (ref 13.0–17.0)
Hemoglobin: 12.5 g/dL — ABNORMAL LOW (ref 13.0–17.0)
MCH: 29.4 pg (ref 26.0–34.0)
MCH: 29.9 pg (ref 26.0–34.0)
MCHC: 31.9 g/dL (ref 30.0–36.0)
MCHC: 32.3 g/dL (ref 30.0–36.0)
MCV: 92.1 fL (ref 80.0–100.0)
MCV: 92.6 fL (ref 80.0–100.0)
Platelets: 221 10*3/uL (ref 150–400)
Platelets: 236 10*3/uL (ref 150–400)
RBC: 4.18 MIL/uL — ABNORMAL LOW (ref 4.22–5.81)
RBC: 4.18 MIL/uL — ABNORMAL LOW (ref 4.22–5.81)
RDW: 13.2 % (ref 11.5–15.5)
RDW: 13.3 % (ref 11.5–15.5)
WBC: 12.9 10*3/uL — ABNORMAL HIGH (ref 4.0–10.5)
WBC: 13.1 10*3/uL — ABNORMAL HIGH (ref 4.0–10.5)
nRBC: 0 % (ref 0.0–0.2)
nRBC: 0 % (ref 0.0–0.2)

## 2019-02-11 LAB — DIFFERENTIAL
Abs Immature Granulocytes: 0.08 10*3/uL — ABNORMAL HIGH (ref 0.00–0.07)
Basophils Absolute: 0.1 10*3/uL (ref 0.0–0.1)
Basophils Relative: 1 %
Eosinophils Absolute: 0.2 10*3/uL (ref 0.0–0.5)
Eosinophils Relative: 2 %
Immature Granulocytes: 1 %
Lymphocytes Relative: 11 %
Lymphs Abs: 1.4 10*3/uL (ref 0.7–4.0)
Monocytes Absolute: 1.4 10*3/uL — ABNORMAL HIGH (ref 0.1–1.0)
Monocytes Relative: 11 %
Neutro Abs: 9.8 10*3/uL — ABNORMAL HIGH (ref 1.7–7.7)
Neutrophils Relative %: 74 %

## 2019-02-11 LAB — HEPATIC FUNCTION PANEL
ALT: 67 U/L — ABNORMAL HIGH (ref 0–44)
AST: 65 U/L — ABNORMAL HIGH (ref 15–41)
Albumin: 3.4 g/dL — ABNORMAL LOW (ref 3.5–5.0)
Alkaline Phosphatase: 64 U/L (ref 38–126)
Bilirubin, Direct: 0.1 mg/dL (ref 0.0–0.2)
Indirect Bilirubin: 0.4 mg/dL (ref 0.3–0.9)
Total Bilirubin: 0.5 mg/dL (ref 0.3–1.2)
Total Protein: 7.1 g/dL (ref 6.5–8.1)

## 2019-02-11 LAB — BASIC METABOLIC PANEL
Anion gap: 7 (ref 5–15)
BUN: 15 mg/dL (ref 8–23)
CO2: 24 mmol/L (ref 22–32)
Calcium: 9.1 mg/dL (ref 8.9–10.3)
Chloride: 104 mmol/L (ref 98–111)
Creatinine, Ser: 0.86 mg/dL (ref 0.61–1.24)
GFR calc Af Amer: >60 mL/min (ref >60)
GFR calc non Af Amer: >60 mL/min (ref >60)
Glucose, Bld: 119 mg/dL — ABNORMAL HIGH (ref 70–99)
Potassium: 3.7 mmol/L (ref 3.5–5.1)
Sodium: 135 mmol/L (ref 135–145)

## 2019-02-11 MED ORDER — OXYCODONE HCL 5 MG PO CAPS: 5.0000 mg | ORAL_CAPSULE | ORAL | 0 refills | Status: DC | PRN

## 2019-02-11 MED ORDER — OXYCODONE HCL 5 MG PO CAPS: 5.0000 mg | ORAL_CAPSULE | ORAL | 0 refills | Status: AC | PRN

## 2019-02-11 MED ORDER — OXYCODONE HCL 5 MG PO TABS
5.0000 mg | ORAL_TABLET | Freq: Once | ORAL | Status: AC
  Administered 2019-02-11: 5 mg via ORAL
  Filled 2019-02-11: qty 1

## 2019-02-11 NOTE — ED Notes (Signed)
Pt verbalized discharge instructions and follow up care. Alert and ambulatory. No IV. Leaving with wife

## 2019-02-11 NOTE — ED Provider Notes (Addendum)
[facility]-EMERGENCY DEPT Provider Note   CSN: [id number] Arrival date & time: 02/11/19  1516     History   Chief Complaint Chief Complaint  Patient presents with  . Urinary Retention  . Urinary Frequency  . Dysuria    HPI [name] is a 78 y.o. male.  Presents to the emergency department with chief complaint of dysuria, suprapubic pain.  Patient states symptoms have been going on since Friday.  Went to [facility]in [facility] on Sunday, noted to have temperature to 101.  They obtained blood work and gave patient dose of Rocephin and discharged with oral cephalexin.  Patient states symptoms have been ongoing since.  States has pain with urination.  Also has dull discomfort in his low abdomen, suprapubic region.  Has not had a fever since Monday.  Denies any flank pain.  No vomiting.  Sometimes feels that he is retaining urine.  This afternoon while in the emergency department though patient feels that he was able to completely empty his bladder.     HPI  Past Medical History:  Diagnosis Date  . Degeneration, intervertebral disc, cervical   . Disc degeneration, lumbar   . Glaucoma   . Hypercholesterolemia   . Hypertropia right  . Low back pain   . Myocardial infarction (HCC)   . 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 One month later, RCA PCI.  Marland Kitchen Ptosis   . Vertical diplopia     Patient Active Problem List   Diagnosis Date Noted  . Obesity 02/19/2014  . Old MI (myocardial infarction) 02/19/2013  . Coronary atherosclerosis of native coronary artery 02/19/2013  . Erectile dysfunction 02/19/2013  . Angina decubitus (HCC) 02/19/2013  . Hypercholesterolemia   . 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 Cataract, nuclear 01/15/2012  . 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 CYSTECTOMY          Home Medications    Prior to Admission medications   Medication Sig Start Date End Date Taking? Authorizing Provider  aspirin 81 MG tablet Take 81 mg by mouth daily.    [provider]  Coenzyme Q10 (CO Q10) 200 MG CAPS Take 200 mg by mouth daily.    [provider]  fexofenadine (ALLEGRA) 180 MG tablet Take 180 mg by mouth daily.    [provider]  fish oil-omega-3 fatty acids 1000 MG capsule Take 2 g by mouth daily.    [provider]  ibuprofen (ADVIL,MOTRIN) 200 MG tablet Take 200 mg by mouth every 6 (six) hours as needed.    [provider]  ketoconazole (NIZORAL) 2 % cream Apply 1 application topically 2 (two) times daily. 05/30/18   Jake Bathe, MD  levothyroxine (SYNTHROID, LEVOTHROID) 50 MCG tablet Take 50 mcg by mouth daily before breakfast.    [provider]  naproxen (NAPROSYN) 500 MG tablet Take 500 mg by mouth once as needed.    [provider]  nitroGLYCERIN (NITROSTAT) 0.4 MG SL tablet PLACE 1 TABLET UNDER THE TONGUE EVERY FIVE MINUTES AS NEEDED FOR CHEST PAIN 05/30/18   Jake Bathe, MD  omeprazole (PRILOSEC) 20 MG capsule Take 20 mg by mouth daily.    [provider]  rosuvastatin (CRESTOR) 20 MG tablet Take 1 tablet (20 mg total) by mouth daily. 05/30/18  Jake BatheSkains, Mark C, MD    Family History Family History  Problem Relation Age of Onset  . Alzheimer's disease Mother   . Congestive Heart Failure Father   . Alzheimer's disease Sister   . Dementia Sister   . Coronary artery disease Brother        , valve replacement CABG    Social History Social History   Tobacco Use  . Smoking status: Never Smoker  . Smokeless tobacco: Never Used  Substance Use Topics  . Alcohol use: No  . Drug use: No     Allergies   Patient has no known allergies.   Review of Systems Review of Systems  Constitutional: Positive for fever. Negative for  chills.  HENT: Negative for ear pain and sore throat.   Eyes: Negative for pain and visual disturbance.  Respiratory: Negative for cough and shortness of breath.   Cardiovascular: Negative for chest pain and palpitations.  Gastrointestinal: Negative for abdominal pain and vomiting.  Genitourinary: Positive for dysuria. Negative for hematuria.  Musculoskeletal: Negative for arthralgias and back pain.  Skin: Negative for color change and rash.  Neurological: Negative for seizures and syncope.  All other systems reviewed and are negative.    Physical Exam Updated Vital Signs BP 138/71   Pulse 77   Temp 98.9 F (37.2 C) (Oral)   Resp 20   SpO2 100%   Physical Exam Vitals signs and nursing note reviewed.  Constitutional:      Appearance: He is well-developed.     Comments: Very well appearing, in no distress  HENT:     Head: Normocephalic and atraumatic.  Eyes:     Conjunctiva/sclera: Conjunctivae normal.  Neck:     Musculoskeletal: Neck supple.  Cardiovascular:     Rate and Rhythm: Normal rate and regular rhythm.     Heart sounds: No murmur.  Pulmonary:     Effort: Pulmonary effort is normal. No respiratory distress.     Breath sounds: Normal breath sounds.  Abdominal:     Palpations: Abdomen is soft.     Tenderness: There is no abdominal tenderness.     Comments: Soft, nontender abd, normal BS  Skin:    General: Skin is warm and dry.     Capillary Refill: Capillary refill takes less than 2 seconds.  Neurological:     General: No focal deficit present.     Mental Status: He is alert and oriented to person, place, and time.  Psychiatric:        Mood and Affect: Mood normal.        Behavior: Behavior normal.      ED Treatments / Results  Labs (all labs ordered are listed, but only abnormal results are displayed) Labs Reviewed  URINALYSIS, ROUTINE W REFLEX MICROSCOPIC - Abnormal; Notable for the following components:      Result Value   Hgb urine dipstick  MODERATE (*)    Protein, ur 30 (*)    Leukocytes,Ua SMALL (*)    All other components within normal limits  BASIC METABOLIC PANEL - Abnormal; Notable for the following components:   Glucose, Bld 119 (*)    All other components within normal limits  CBC - Abnormal; Notable for the following components:   WBC 13.1 (*)    RBC 4.18 (*)    Hemoglobin 12.3 (*)    HCT 38.5 (*)    All other components within normal limits  DIFFERENTIAL - Abnormal; Notable for the following components:   Neutro  Abs 9.8 (*)    Monocytes Absolute 1.4 (*)    Abs Immature Granulocytes 0.08 (*)    All other components within normal limits  HEPATIC FUNCTION PANEL - Abnormal; Notable for the following components:   Albumin 3.4 (*)    AST 65 (*)    ALT 67 (*)    All other components within normal limits  CBC - Abnormal; Notable for the following components:   WBC 12.9 (*)    RBC 4.18 (*)    Hemoglobin 12.5 (*)    HCT 38.7 (*)    All other components within normal limits  URINE CULTURE  CULTURE, BLOOD (ROUTINE X 2)    EKG None  Radiology No results found.  Procedures Procedures (including critical care time)  Medications Ordered in ED Medications  oxyCODONE (Oxy IR/ROXICODONE) immediate release tablet 5 mg (5 mg Oral Given 02/11/19 1729)     Initial Impression / Assessment and Plan / ED Course  I have reviewed the triage vital signs and the nursing notes.  Pertinent labs & imaging results that were available during my care of the patient were reviewed by me and considered in my medical decision making (see chart for details).  Clinical Course as of Mar 05 1031  Tue Feb 11, 2019  1713 Kenton Kingfisher saw patient on Tuesday; PCP is Joneen Caraway   [RD]  1750 Discussed with Sadie Haber PCP on call for Oletta Darter - WBC was elevated to 26 but came down to 15   [RD]    Clinical Course User Index [RD] Lucrezia Starch, MD       78 year old presents to ER with few days of dysuria.  Febrile to 101, WBCs up to 25  at office.  Today WBC is down to 12.  Patient is afebrile and well-appearing.  Urinating spontaneously without difficulty in ER.  Repeat urine looks okay.  Will send for culture.  His primary doctor also had sent for culture but does not have the result back.  CT abdomen pelvis obtained to rule out any obstructive pathology.  CT abdomen negative.  Noted mild prostate enlargement.  I recommend patient continue the course of cephalexin and have a follow-up appointment on Thursday or Friday with his primary doctor.  Given his improving WBCs, his presentation and exam today, believe he is appropriate for further management of his urinary tract infection.  After the discussed management above, the patient was determined to be safe for discharge.  The patient was in agreement with this plan and all questions regarding their care were answered.  ED return precautions were discussed and the patient will return to the ED with any significant worsening of condition.    Final Clinical Impressions(s) / ED Diagnoses   Final diagnoses:  Acute cystitis with hematuria    ED Discharge Orders         Ordered    oxycodone (OXY-IR) 5 MG capsule  Every 4 hours PRN,   Status:  Discontinued     02/11/19 1852    oxycodone (OXY-IR) 5 MG capsule  Every 4 hours PRN     02/11/19 1918           Lucrezia Starch, MD 02/11/19 2001    Lucrezia Starch, MD 03/05/19 1032

## 2019-02-11 NOTE — ED Triage Notes (Addendum)
Patient here from [facility] with complaints of urinary retention, frequency, and burning that started last Friday. Given Rocephin IM and put on Cephalexin oral with no relief. Tylenol every 6 hours.

## 2019-02-11 NOTE — ED Notes (Signed)
EDP at bedside  

## 2019-02-11 NOTE — Discharge Instructions (Signed)
Please call your primary doctor to schedule appointment for recheck on Friday.  If you develop a recurrence of your fever, worsening abdominal pain, urinary retention, please return to the emergency department for reassessment.  Recommend continuing the previously prescribed cephalexin until the antibiotic is finished.

## 2019-02-12 LAB — URINE CULTURE: Culture: NO GROWTH

## 2019-02-16 LAB — CULTURE, BLOOD (ROUTINE X 2)
Culture: NO GROWTH
Special Requests: ADEQUATE

## 2019-02-18 DIAGNOSIS — E039 Hypothyroidism, unspecified: Secondary | ICD-10-CM | POA: Diagnosis not present

## 2019-02-18 DIAGNOSIS — E7800 Pure hypercholesterolemia, unspecified: Secondary | ICD-10-CM | POA: Diagnosis not present

## 2019-02-18 DIAGNOSIS — K219 Gastro-esophageal reflux disease without esophagitis: Secondary | ICD-10-CM | POA: Diagnosis not present

## 2019-02-18 DIAGNOSIS — Z0000 Encounter for general adult medical examination without abnormal findings: Secondary | ICD-10-CM | POA: Diagnosis not present

## 2019-03-02 IMAGING — CR DG ORBITS FOR FOREIGN BODY
2 series · 2 of 2 positions shown · non-contrast
Comparison: None.

CLINICAL DATA: Metal working/exposure; clearance prior to MRI

EXAM:
ORBITS FOR FOREIGN BODY - 2 VIEW

[w orbit pa (1 of 2)]
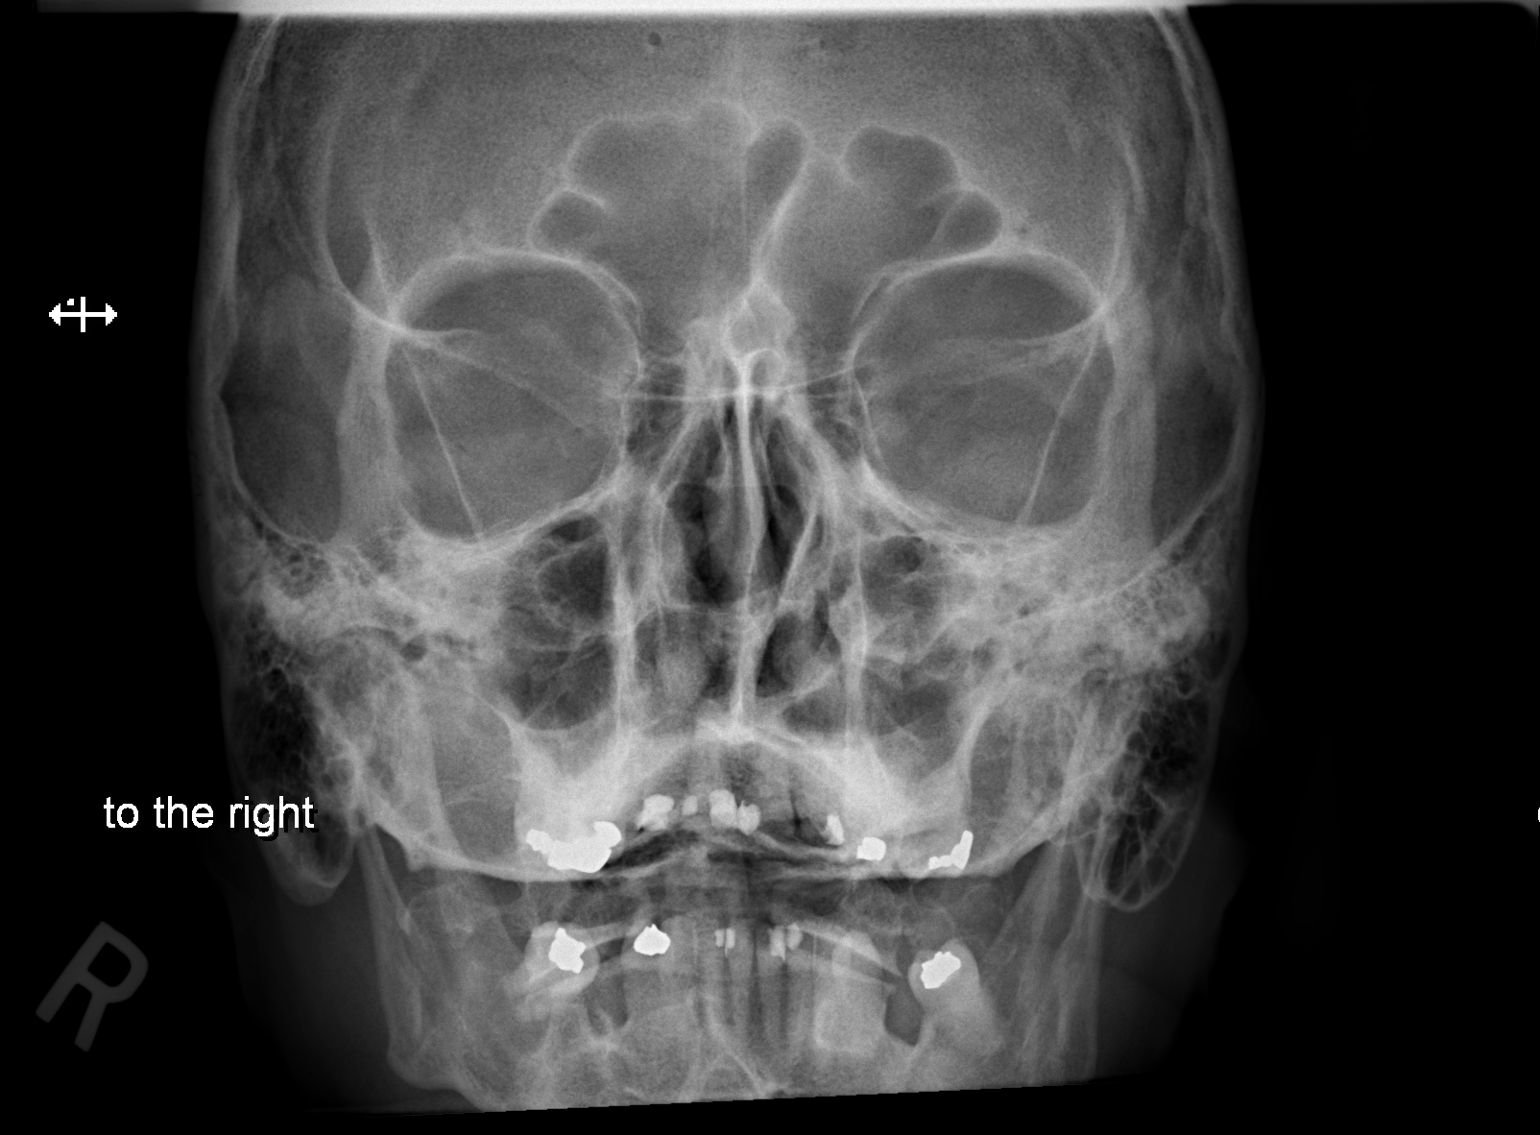

[w orbit pa (2 of 2)]
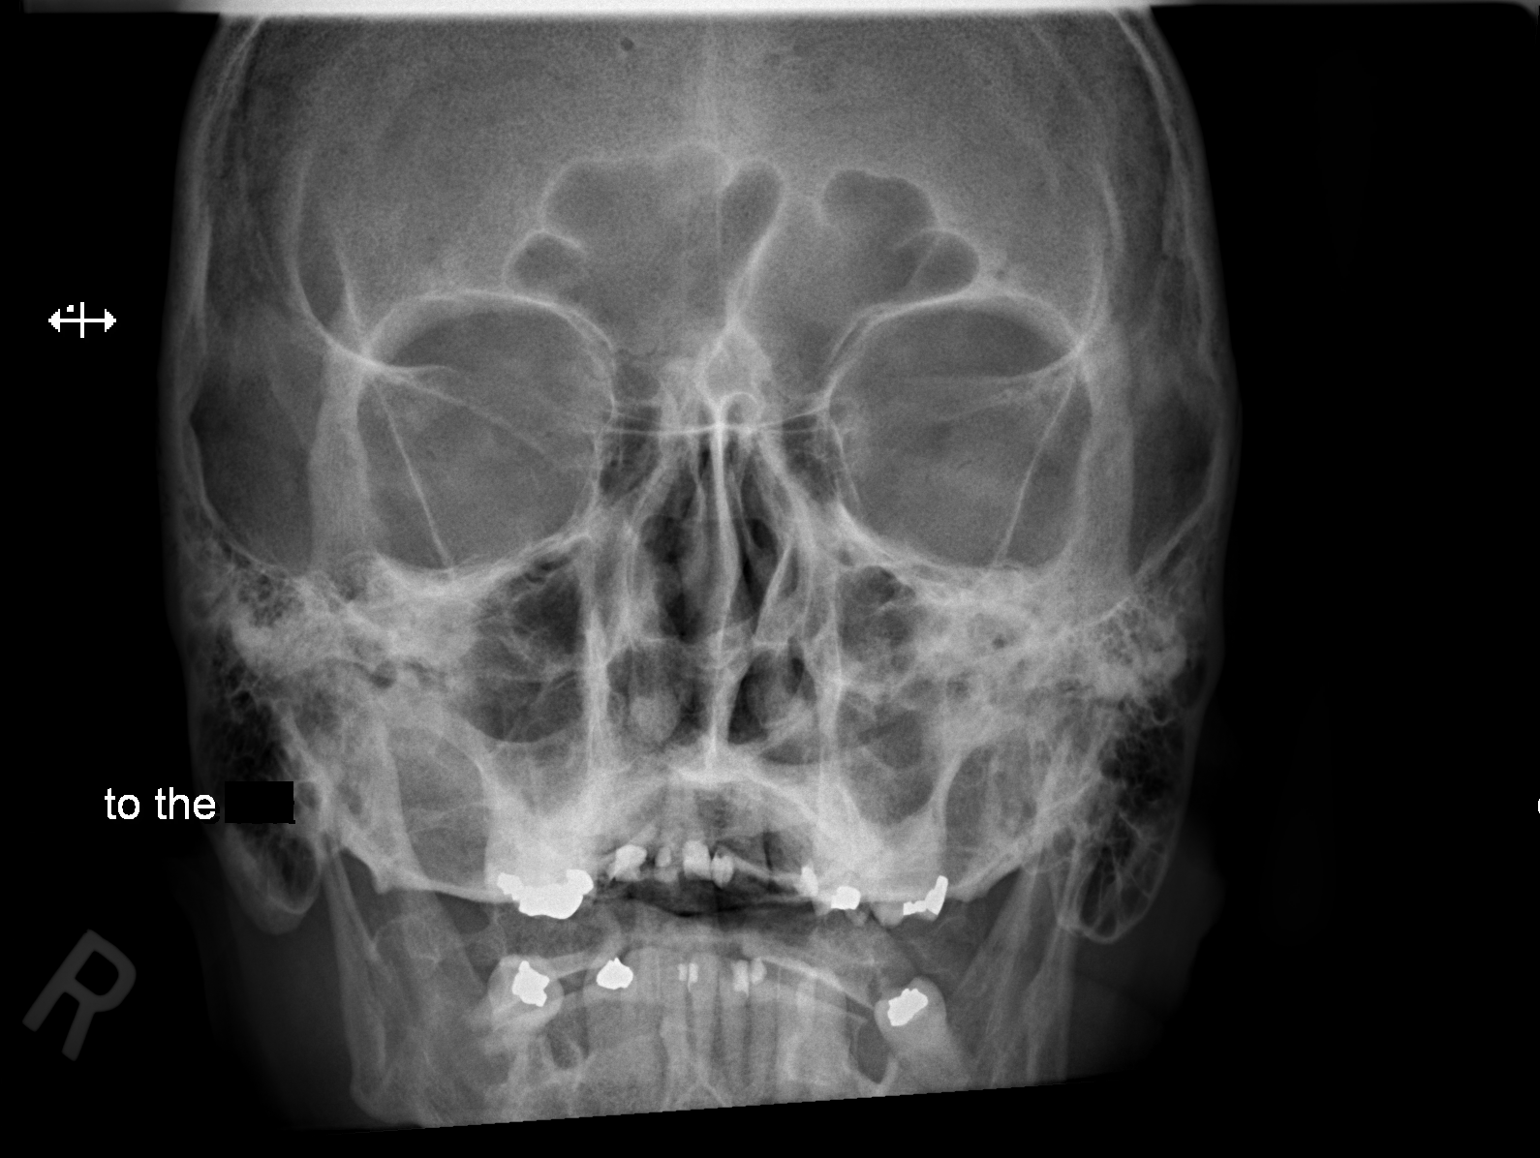

[2 of 2 positions shown; findings below may reference images not displayed]

FINDINGS: There is no evidence of metallic foreign body within the orbits. No
significant bone abnormality identified.
IMPRESSION: No evidence of metallic foreign body within the orbits.

## 2019-05-28 DIAGNOSIS — R946 Abnormal results of thyroid function studies: Secondary | ICD-10-CM | POA: Diagnosis not present

## 2019-05-28 DIAGNOSIS — D72829 Elevated white blood cell count, unspecified: Secondary | ICD-10-CM | POA: Diagnosis not present

## 2019-05-30 ENCOUNTER — Other Ambulatory Visit: Payer: Self-pay

## 2019-05-30 ENCOUNTER — Ambulatory Visit: Payer: Medicare Other | Admitting: Cardiology

## 2019-05-30 ENCOUNTER — Encounter: Payer: Self-pay | Admitting: Cardiology

## 2019-05-30 VITALS — BP 144/80 | HR 73 | Ht 67.0 in | Wt 209.0 lb

## 2019-05-30 DIAGNOSIS — I2510 Atherosclerotic heart disease of native coronary artery without angina pectoris: Secondary | ICD-10-CM

## 2019-05-30 DIAGNOSIS — I252 Old myocardial infarction: Secondary | ICD-10-CM

## 2019-05-30 NOTE — Patient Instructions (Signed)
Medication Instructions:  The current medical regimen is effective;  continue present plan and medications.  *If you need a refill on your cardiac medications before your next appointment, please call your pharmacy*  Follow-Up: At [facility], you and your health needs are our priority.  As part of our continuing mission to provide you with exceptional heart care, we have created designated Provider Care Teams.  These Care Teams include your primary Cardiologist (physician) and Advanced Practice Providers (APPs -  Physician Assistants and Nurse Practitioners) who all work together to provide you with the care you need, when you need it.  Your next appointment:   12 month(s)  The format for your next appointment:   In Person  Provider:   Mark Skains, MD   Thank you for choosing [facility]!!     

## 2019-05-30 NOTE — Progress Notes (Signed)
Cardiology Office Note:    Date:  05/30/2019   ID:  [name], DOB 1941/02/23, MRN [id number]  PCP:  Elias Else, MD  Cardiologist:  Donato Schultz, MD  Electrophysiologist:  None   Referring MD: Elias Else, MD     History of Present Illness:    [name] is a 79 y.o. male coronary artery disease status post RCA stent in 2004 with a Taxus 3.0 x 12 mm meter here for follow-up.  Previously had central chest discomfort with anterior lateral infarct with occluded diagonal.  1 month later he had an RCA stent.  Minimal shortness of breath with activity.  [place] in [place]  [place]  Previously felt sluggish with Vytorin.  Now with Crestor 20 mg.  Shooting for an LDL goal of 70.  Recently went emergency department with cystitis.  He has been treated with this.  Blood work has not been done recently.  He also had his annual physical in November.  Past Medical History:  Diagnosis Date  . Degeneration, intervertebral disc, cervical   . Disc degeneration, lumbar   . Glaucoma   . Hypercholesterolemia   . Hypertropia right  . Low back pain   . Myocardial infarction (HCC)   . 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 One month later, RCA PCI.  Marland Kitchen Ptosis   . Vertical diplopia     Past Surgical History:  Procedure Laterality Date  . CYSTECTOMY      Current Medications: Current Meds  Medication Sig  . aspirin 81 MG tablet Take 81 mg by mouth daily.  . Coenzyme Q10 (CO Q10) 200 MG CAPS Take 200 mg by mouth daily.  . fexofenadine (ALLEGRA) 180 MG tablet Take 180 mg by mouth daily.  . fish oil-omega-3 fatty acids 1000 MG capsule Take 2 g by mouth daily.  Marland Kitchen ibuprofen (ADVIL,MOTRIN) 200 MG tablet Take 200 mg by mouth every 6 (six) hours as needed.  Marland Kitchen ketoconazole (NIZORAL) 2 % cream Apply 1 application topically 2 (two) times daily.  Marland Kitchen levothyroxine (SYNTHROID, LEVOTHROID) 50 MCG tablet Take 50  mcg by mouth daily before breakfast.  . naproxen (NAPROSYN) 500 MG tablet Take 500 mg by mouth once as needed.  . nitroGLYCERIN (NITROSTAT) 0.4 MG SL tablet PLACE 1 TABLET UNDER THE TONGUE EVERY FIVE MINUTES AS NEEDED FOR CHEST PAIN  . omeprazole (PRILOSEC) 20 MG capsule Take 20 mg by mouth daily.  . rosuvastatin (CRESTOR) 20 MG tablet Take 1 tablet (20 mg total) by mouth daily.     Allergies:   Patient has no known allergies.   Social History   Socioeconomic History  . Marital status: Single    Spouse name: Not on file  . Number of children: Not on file  . Years of education: Not on file  . Highest education level: Not on file  Occupational History  . Not on file  Tobacco Use  . Smoking status: Never Smoker  . Smokeless tobacco: Never Used  Substance and Sexual Activity  . Alcohol use: No  . Drug use: No  . Sexual activity: Not on file  Other Topics Concern  . Not on file  Social History Narrative   Enjoys fishing at [employer]   Social Determinants of Health   Financial Resource Strain:   . Difficulty of Paying Living Expenses: Not on file  Food Insecurity:   . Worried About Programme researcher, broadcasting/film/video in the Last  Year: Not on file  . Ran Out of Food in the Last Year: Not on file  Transportation Needs:   . Lack of Transportation (Medical): Not on file  . Lack of Transportation (Non-Medical): Not on file  Physical Activity:   . Days of Exercise per Week: Not on file  . Minutes of Exercise per Session: Not on file  Stress:   . Feeling of Stress : Not on file  Social Connections:   . Frequency of Communication with Friends and Family: Not on file  . Frequency of Social Gatherings with Friends and Family: Not on file  . Attends Religious Services: Not on file  . Active Member of Clubs or Organizations: Not on file  . Attends Archivist Meetings: Not on file  . Marital Status: Not on file     Family History: The patient's family history includes Alzheimer's  disease in his mother and sister; Congestive Heart Failure in his father; Coronary artery disease in his brother; Dementia in his sister.  ROS:   Please see the history of present illness.    No fevers chills nausea vomiting syncope bleeding all other systems reviewed and are negative.  EKGs/Labs/Other Studies Reviewed:    The following studies were reviewed today: Prior cath data reviewed as above  EKG:  EKG is  ordered today.  The ekg ordered today demonstrates 05/30/2019 normal sinus rhythm 73 with no other abnormalities.  Recent Labs: 02/11/2019: ALT 67; BUN 15; Creatinine, Ser 0.86; Hemoglobin 12.3; Hemoglobin 12.5; Platelets 221; Platelets 236; Potassium 3.7; Sodium 135  Recent Lipid Panel    Component Value Date/Time   CHOL 149 05/30/2018 0921   CHOL 128 02/07/2013 0906   TRIG 102 05/30/2018 0921   TRIG 93 02/07/2013 0906   HDL 48 05/30/2018 0921   HDL 49 02/07/2013 0906   CHOLHDL 3.1 05/30/2018 0921   CHOLHDL 3.3 09/29/2015 0827   VLDL 17 09/29/2015 0827   LDLCALC 81 05/30/2018 0921   LDLCALC 60 02/07/2013 0906    Physical Exam:    VS:  BP (!) 144/80   Pulse 73   Ht 5\' 7"  (1.702 m)   Wt 209 lb (94.8 kg)   SpO2 96%   BMI 32.73 kg/m     Wt Readings from Last 3 Encounters:  05/30/19 209 lb (94.8 kg)  05/30/18 212 lb 6.4 oz (96.3 kg)  06/01/17 206 lb 12.8 oz (93.8 kg)     GEN:  Well nourished, well developed in no acute distress HEENT: Normal NECK: No JVD; No carotid bruits LYMPHATICS: No lymphadenopathy CARDIAC: RRR, no murmurs, rubs, gallops RESPIRATORY:  Clear to auscultation without rales, wheezing or rhonchi  ABDOMEN: Soft, non-tender, non-distended MUSCULOSKELETAL:  No edema; No deformity  SKIN: Warm and dry NEUROLOGIC:  Alert and oriented x 3 PSYCHIATRIC:  Normal affect   ASSESSMENT:    1. Old MI (myocardial infarction)   2. Atherosclerosis of native coronary artery of native heart without angina pectoris    PLAN:    In order of problems  listed above:  Coronary artery disease -RCA stent 2004, prior diagonal occlusion.  Overall doing well with aggressive secondary risk factor prevention.  Angina -Severe angina previously was substernal chest discomfort.  Only has minimal shortness of breath.  Continue with aggressive secondary prevention.  Hyperlipidemia -Crestor.  Shooting for a goal of LDL 70.  Overall doing well.  No myalgias.  LDL 65 HDL 47.  Creatinine 0.9 hemoglobin 14, TSH 4.5  Recent cystitis was treated.  Encouraged COVID-19 vaccine.    Medication Adjustments/Labs and Tests Ordered: Current medicines are reviewed at length with the patient today.  Concerns regarding medicines are outlined above.  Orders Placed This Encounter  Procedures  . EKG 12-Lead   No orders of the defined types were placed in this encounter.   Patient Instructions  Medication Instructions:  The current medical regimen is effective;  continue present plan and medications.  *If you need a refill on your cardiac medications before your next appointment, please call your pharmacy*  Follow-Up: At [facility], you and your health needs are our priority.  As part of our continuing mission to provide you with exceptional heart care, we have created designated Provider Care Teams.  These Care Teams include your primary Cardiologist (physician) and Advanced Practice Providers (APPs -  Physician Assistants and Nurse Practitioners) who all work together to provide you with the care you need, when you need it.  Your next appointment:   12 month(s)  The format for your next appointment:   In Person  Provider:   Donato Schultz, MD  Thank you for choosing [facility]!!        Signed, Donato Schultz, MD  05/30/2019 2:10 PM    [facility]

## 2019-06-16 DIAGNOSIS — H401423 Capsular glaucoma with pseudoexfoliation of lens, left eye, severe stage: Secondary | ICD-10-CM | POA: Diagnosis not present

## 2019-06-16 DIAGNOSIS — H401413 Capsular glaucoma with pseudoexfoliation of lens, right eye, severe stage: Secondary | ICD-10-CM | POA: Diagnosis not present

## 2019-06-25 DIAGNOSIS — I2109 ST elevation (STEMI) myocardial infarction involving other coronary artery of anterior wall: Secondary | ICD-10-CM | POA: Diagnosis not present

## 2019-06-25 DIAGNOSIS — E7800 Pure hypercholesterolemia, unspecified: Secondary | ICD-10-CM | POA: Diagnosis not present

## 2019-06-25 DIAGNOSIS — E039 Hypothyroidism, unspecified: Secondary | ICD-10-CM | POA: Diagnosis not present

## 2019-06-25 DIAGNOSIS — I2510 Atherosclerotic heart disease of native coronary artery without angina pectoris: Secondary | ICD-10-CM | POA: Diagnosis not present

## 2019-07-03 ENCOUNTER — Other Ambulatory Visit: Payer: Self-pay | Admitting: Cardiology

## 2019-07-24 ENCOUNTER — Other Ambulatory Visit: Payer: Self-pay | Admitting: Cardiology

## 2019-09-23 DIAGNOSIS — E039 Hypothyroidism, unspecified: Secondary | ICD-10-CM | POA: Diagnosis not present

## 2019-09-23 DIAGNOSIS — I2510 Atherosclerotic heart disease of native coronary artery without angina pectoris: Secondary | ICD-10-CM | POA: Diagnosis not present

## 2019-09-23 DIAGNOSIS — I2109 ST elevation (STEMI) myocardial infarction involving other coronary artery of anterior wall: Secondary | ICD-10-CM | POA: Diagnosis not present

## 2019-09-23 DIAGNOSIS — E7800 Pure hypercholesterolemia, unspecified: Secondary | ICD-10-CM | POA: Diagnosis not present

## 2019-10-22 DIAGNOSIS — J310 Chronic rhinitis: Secondary | ICD-10-CM | POA: Diagnosis not present

## 2019-10-30 DIAGNOSIS — H401433 Capsular glaucoma with pseudoexfoliation of lens, bilateral, severe stage: Secondary | ICD-10-CM | POA: Diagnosis not present

## 2019-12-08 DIAGNOSIS — H401413 Capsular glaucoma with pseudoexfoliation of lens, right eye, severe stage: Secondary | ICD-10-CM | POA: Diagnosis not present

## 2019-12-08 DIAGNOSIS — H401423 Capsular glaucoma with pseudoexfoliation of lens, left eye, severe stage: Secondary | ICD-10-CM | POA: Diagnosis not present

## 2019-12-25 DIAGNOSIS — I2510 Atherosclerotic heart disease of native coronary artery without angina pectoris: Secondary | ICD-10-CM | POA: Diagnosis not present

## 2019-12-25 DIAGNOSIS — I2109 ST elevation (STEMI) myocardial infarction involving other coronary artery of anterior wall: Secondary | ICD-10-CM | POA: Diagnosis not present

## 2019-12-25 DIAGNOSIS — E039 Hypothyroidism, unspecified: Secondary | ICD-10-CM | POA: Diagnosis not present

## 2019-12-25 DIAGNOSIS — K219 Gastro-esophageal reflux disease without esophagitis: Secondary | ICD-10-CM | POA: Diagnosis not present

## 2020-01-14 DIAGNOSIS — Z0120 Encounter for dental examination and cleaning without abnormal findings: Secondary | ICD-10-CM | POA: Diagnosis not present

## 2020-02-24 DIAGNOSIS — K219 Gastro-esophageal reflux disease without esophagitis: Secondary | ICD-10-CM | POA: Diagnosis not present

## 2020-02-24 DIAGNOSIS — Z0000 Encounter for general adult medical examination without abnormal findings: Secondary | ICD-10-CM | POA: Diagnosis not present

## 2020-02-24 DIAGNOSIS — E7800 Pure hypercholesterolemia, unspecified: Secondary | ICD-10-CM | POA: Diagnosis not present

## 2020-02-24 DIAGNOSIS — E039 Hypothyroidism, unspecified: Secondary | ICD-10-CM | POA: Diagnosis not present

## 2020-03-18 DIAGNOSIS — I2109 ST elevation (STEMI) myocardial infarction involving other coronary artery of anterior wall: Secondary | ICD-10-CM | POA: Diagnosis not present

## 2020-03-18 DIAGNOSIS — K219 Gastro-esophageal reflux disease without esophagitis: Secondary | ICD-10-CM | POA: Diagnosis not present

## 2020-03-18 DIAGNOSIS — E039 Hypothyroidism, unspecified: Secondary | ICD-10-CM | POA: Diagnosis not present

## 2020-03-18 DIAGNOSIS — I2510 Atherosclerotic heart disease of native coronary artery without angina pectoris: Secondary | ICD-10-CM | POA: Diagnosis not present

## 2020-04-15 ENCOUNTER — Telehealth: Payer: Self-pay | Admitting: Cardiology

## 2020-04-15 DIAGNOSIS — E7800 Pure hypercholesterolemia, unspecified: Secondary | ICD-10-CM

## 2020-04-15 DIAGNOSIS — I2510 Atherosclerotic heart disease of native coronary artery without angina pectoris: Secondary | ICD-10-CM

## 2020-04-15 DIAGNOSIS — Z79899 Other long term (current) drug therapy: Secondary | ICD-10-CM

## 2020-04-15 NOTE — Telephone Encounter (Signed)
Patient wanted to know if Dr. Anne Fu would put in lab orders for him. He would like to come to his 02.15.21 appt early and have lab work done then to save himself a trip.  Please let him know what he can do

## 2020-04-15 NOTE — Telephone Encounter (Signed)
Lets check a lipid panel, ALT, basic metabolic profile, CBC since he is on Plavix, statin Ryan Schultz, MD

## 2020-04-15 NOTE — Telephone Encounter (Signed)
Would you want a Lipid/ALT on this pt?

## 2020-04-16 NOTE — Telephone Encounter (Signed)
Pt aware of orders and will come in early 2/15 for lab work.

## 2020-05-25 ENCOUNTER — Other Ambulatory Visit: Payer: Self-pay

## 2020-05-25 ENCOUNTER — Encounter: Payer: Self-pay | Admitting: Cardiology

## 2020-05-25 ENCOUNTER — Ambulatory Visit: Payer: Medicare Other | Admitting: Cardiology

## 2020-05-25 ENCOUNTER — Other Ambulatory Visit: Payer: Medicare Other | Admitting: *Deleted

## 2020-05-25 VITALS — BP 140/80 | HR 80 | Ht 67.0 in | Wt 169.0 lb

## 2020-05-25 DIAGNOSIS — E7800 Pure hypercholesterolemia, unspecified: Secondary | ICD-10-CM

## 2020-05-25 DIAGNOSIS — I2510 Atherosclerotic heart disease of native coronary artery without angina pectoris: Secondary | ICD-10-CM | POA: Diagnosis not present

## 2020-05-25 DIAGNOSIS — I252 Old myocardial infarction: Secondary | ICD-10-CM

## 2020-05-25 DIAGNOSIS — Z79899 Other long term (current) drug therapy: Secondary | ICD-10-CM | POA: Diagnosis not present

## 2020-05-25 LAB — CBC
Hematocrit: 41.3 % (ref 37.5–51.0)
Hemoglobin: 13.9 g/dL (ref 13.0–17.7)
MCH: 28.8 pg (ref 26.6–33.0)
MCHC: 33.7 g/dL (ref 31.5–35.7)
MCV: 86 fL (ref 79–97)
Platelets: 284 10*3/uL (ref 150–450)
RBC: 4.82 x10E6/uL (ref 4.14–5.80)
RDW: 12 % (ref 11.6–15.4)
WBC: 13.7 10*3/uL — ABNORMAL HIGH (ref 3.4–10.8)

## 2020-05-25 LAB — BASIC METABOLIC PANEL
BUN/Creatinine Ratio: 13 (ref 10–24)
BUN: 10 mg/dL (ref 8–27)
CO2: 27 mmol/L (ref 20–29)
Calcium: 9.8 mg/dL (ref 8.6–10.2)
Chloride: 101 mmol/L (ref 96–106)
Creatinine, Ser: 0.8 mg/dL (ref 0.76–1.27)
GFR calc Af Amer: 98 mL/min/{1.73_m2} (ref >59)
GFR calc non Af Amer: 84 mL/min/{1.73_m2} (ref >59)
Glucose: 110 mg/dL — ABNORMAL HIGH (ref 65–99)
Potassium: 4.6 mmol/L (ref 3.5–5.2)
Sodium: 140 mmol/L (ref 134–144)

## 2020-05-25 LAB — LIPID PANEL
Chol/HDL Ratio: 3.2 ratio (ref 0.0–5.0)
Cholesterol, Total: 155 mg/dL (ref 100–199)
HDL: 48 mg/dL (ref >39)
LDL Chol Calc (NIH): 90 mg/dL (ref 0–99)
Triglycerides: 89 mg/dL (ref 0–149)
VLDL Cholesterol Cal: 17 mg/dL (ref 5–40)

## 2020-05-25 LAB — ALT: ALT: 20 IU/L (ref 0–44)

## 2020-05-25 NOTE — Progress Notes (Signed)
Cardiology Office Note:    Date:  05/25/2020   ID:  [name], DOB November 04, 1940, MRN [id number]  PCP:  Elias Else, MD   [facility]  Cardiologist:  Donato Schultz, MD  Advanced Practice Provider:  No care team member to display Electrophysiologist:  None       Referring MD: Elias Else, MD     History of Present Illness:    [name] is a 80 y.o. male here for the follow-up of coronary artery disease status post RCA stent in 2004, Taxus 3 x 12 mm.  His prior angina was central chest discomfort.  Had a anterior lateral infarct with occluded diagonal as well.  1 month later had his RCA stent.  Previously had some sluggishness with Vytorin.  Now on Crestor doing well.  Had cystitis previously.    Overall been doing quite well.  No fevers chills nausea vomiting syncope bleeding.  No chest pain no shortness of breath.  He has been watching his blood sugars closely.  Staying below 110 at home.  Past Medical History:  Diagnosis Date  . Degeneration, intervertebral disc, cervical   . Disc degeneration, lumbar   . Glaucoma   . Hypercholesterolemia   . Hypertropia right  . Low back pain   . Myocardial infarction (HCC)   . 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 One month later, RCA PCI.  Marland Kitchen Ptosis   . Vertical diplopia     Past Surgical History:  Procedure Laterality Date  . CYSTECTOMY      Current Medications: Current Meds  Medication Sig  . aspirin 81 MG tablet Take 81 mg by mouth daily.  . Coenzyme Q10 (CO Q10) 200 MG CAPS Take 200 mg by mouth daily.  . fexofenadine (ALLEGRA) 180 MG tablet Take 180 mg by mouth daily.  . fish oil-omega-3 fatty acids 1000 MG capsule Take 2 g by mouth daily.  Marland Kitchen ibuprofen (ADVIL,MOTRIN) 200 MG tablet Take 200 mg by mouth every 6 (six) hours as needed.  Marland Kitchen ketoconazole (NIZORAL) 2 % cream Apply 1 application topically 2 (two) times daily.  Marland Kitchen  levothyroxine (SYNTHROID) 75 MCG tablet Take 75 mcg by mouth daily.  . naproxen (NAPROSYN) 500 MG tablet Take 500 mg by mouth once as needed.  . nitroGLYCERIN (NITROSTAT) 0.4 MG SL tablet DISSOLVE 1 TABLET UNDER THE TONGUE EVERY 5 MINUTES FOR UP TO 3 DOSES AS NEEDED FOR CHEST PAIN. IF NO RELIEF AFTER 3 DOSES, CALL 911 OR GO TO ER.  Marland Kitchen omeprazole (PRILOSEC) 20 MG capsule Take 20 mg by mouth daily.  . rosuvastatin (CRESTOR) 20 MG tablet TAKE 1 TABLET BY MOUTH DAILY     Allergies:   Patient has no known allergies.   Social History   Socioeconomic History  . Marital status: Single    Spouse name: Not on file  . Number of children: Not on file  . Years of education: Not on file  . Highest education level: Not on file  Occupational History  . Not on file  Tobacco Use  . Smoking status: Never Smoker  . Smokeless tobacco: Never Used  Substance and Sexual Activity  . Alcohol use: No  . Drug use: No  . Sexual activity: Not on file  Other Topics Concern  . Not on file  Social History Narrative   Enjoys fishing at [employer]   Social Determinants of Health   Financial Resource Strain: Not on file  Food Insecurity: Not on file  Transportation Needs: Not on file  Physical Activity: Not on file  Stress: Not on file  Social Connections: Not on file     Family History: The patient's family history includes Alzheimer's disease in his mother and sister; Congestive Heart Failure in his father; Coronary artery disease in his brother; Dementia in his sister.  ROS:   Please see the history of present illness.     All other systems reviewed and are negative.  EKGs/Labs/Other Studies Reviewed:     EKG:  EKG is  ordered today.  The ekg ordered today demonstrates sinus rhythm 80 with PVC  Recent Labs: No results found for requested labs within last 8760 hours.  Recent Lipid Panel    Component Value Date/Time   CHOL 149 05/30/2018 0921   CHOL 128 02/07/2013 0906   TRIG 102  05/30/2018 0921   TRIG 93 02/07/2013 0906   HDL 48 05/30/2018 0921   HDL 49 02/07/2013 0906   CHOLHDL 3.1 05/30/2018 0921   CHOLHDL 3.3 09/29/2015 0827   VLDL 17 09/29/2015 0827   LDLCALC 81 05/30/2018 0921   LDLCALC 60 02/07/2013 0906     Risk Assessment/Calculations:      Physical Exam:    VS:  BP 140/80 (BP Location: Left Arm, Patient Position: Sitting, Cuff Size: Normal)   Pulse 80   Ht 5\' 7"  (1.702 m)   Wt 169 lb (76.7 kg)   SpO2 95%   BMI 26.47 kg/m     Wt Readings from Last 3 Encounters:  05/25/20 169 lb (76.7 kg)  05/30/19 209 lb (94.8 kg)  05/30/18 212 lb 6.4 oz (96.3 kg)     GEN:  Well nourished, well developed in no acute distress HEENT: Normal NECK: No JVD; No carotid bruits LYMPHATICS: No lymphadenopathy CARDIAC: RRR, no murmurs, rubs, gallops RESPIRATORY:  Clear to auscultation without rales, wheezing or rhonchi  ABDOMEN: Soft, non-tender, non-distended MUSCULOSKELETAL:  No edema; No deformity  SKIN: Warm and dry NEUROLOGIC:  Alert and oriented x 3 PSYCHIATRIC:  Normal affect   ASSESSMENT:    1. Atherosclerosis of native coronary artery of native heart without angina pectoris   2. Hypercholesterolemia   3. Old MI (myocardial infarction)    PLAN:    In order of problems listed above:  Coronary artery disease -Early drug-eluting stent, continuing with aspirin.  Sometimes will utilize clopidogrel lifelong in the settings however he has not had any issues for several years on the aspirin.  Hyperlipidemia -Continuing with Crestor 20, omega-3.  Co-Q10 as well.  Doing well.  No myalgias.  Checking lab work.  Prior LDL was 97.  Goal less than 70.  Prior anterolateral MI -Stable.  2004.  Essential hypertension -Monitoring at home.  He is also monitoring his blood sugar at the request of Dr. 2005    Medication Adjustments/Labs and Tests Ordered: Current medicines are reviewed at length with the patient today.  Concerns regarding medicines are  outlined above.  No orders of the defined types were placed in this encounter.  No orders of the defined types were placed in this encounter.   Patient Instructions  Medication Instructions:  The current medical regimen is effective;  continue present plan and medications.  *If you need a refill on your cardiac medications before your next appointment, please call your pharmacy*  Lab Work: Please have blood work as previously scheduled. (CBC, BMP, Lipid and ALT) If you have labs (blood work) drawn today and your  tests are completely normal, you will receive your results only by: Marland Kitchen MyChart Message (if you have MyChart) OR . A paper copy in the mail If you have any lab test that is abnormal or we need to change your treatment, we will call you to review the results.  Follow-Up: At [facility], you and your health needs are our priority.  As part of our continuing mission to provide you with exceptional heart care, we have created designated Provider Care Teams.  These Care Teams include your primary Cardiologist (physician) and Advanced Practice Providers (APPs -  Physician Assistants and Nurse Practitioners) who all work together to provide you with the care you need, when you need it.  We recommend signing up for the patient portal called "MyChart".  Sign up information is provided on this After Visit Summary.  MyChart is used to connect with patients for Virtual Visits (Telemedicine).  Patients are able to view lab/test results, encounter notes, upcoming appointments, etc.  Non-urgent messages can be sent to your provider as well.   To learn more about what you can do with MyChart, go to [email or web address].    Your next appointment:   12 month(s)  The format for your next appointment:   In Person  Provider:   Donato Schultz, MD   Thank you for choosing [facility]!!        Signed, Donato Schultz, MD  05/25/2020 10:24 AM    [facility]

## 2020-05-25 NOTE — Patient Instructions (Addendum)
Medication Instructions:  The current medical regimen is effective;  continue present plan and medications.  *If you need a refill on your cardiac medications before your next appointment, please call your pharmacy*  Lab Work: Please have blood work as previously scheduled. (CBC, BMP, Lipid and ALT) If you have labs (blood work) drawn today and your tests are completely normal, you will receive your results only by: Marland Kitchen MyChart Message (if you have MyChart) OR . A paper copy in the mail If you have any lab test that is abnormal or we need to change your treatment, we will call you to review the results.  Follow-Up: At [facility], you and your health needs are our priority.  As part of our continuing mission to provide you with exceptional heart care, we have created designated Provider Care Teams.  These Care Teams include your primary Cardiologist (physician) and Advanced Practice Providers (APPs -  Physician Assistants and Nurse Practitioners) who all work together to provide you with the care you need, when you need it.  We recommend signing up for the patient portal called "MyChart".  Sign up information is provided on this After Visit Summary.  MyChart is used to connect with patients for Virtual Visits (Telemedicine).  Patients are able to view lab/test results, encounter notes, upcoming appointments, etc.  Non-urgent messages can be sent to your provider as well.   To learn more about what you can do with MyChart, go to [email or web address].    Your next appointment:   12 month(s)  The format for your next appointment:   In Person  Provider:   Donato Schultz, MD   Thank you for choosing [facility]!!

## 2020-05-26 ENCOUNTER — Telehealth: Payer: Self-pay

## 2020-05-26 NOTE — Telephone Encounter (Signed)
Ryan Bathe, MD   Cc: P Cv Div [facility] Triage Normal liver and kidney function  LDL 90 on Crestor 20. Goal <70  Continue with diet and current meds. Called pt and gave him the above results, per MD. Pt verbalized understanding and has no questions/concerns. Per his request I have mailed him his results to his home address.

## 2020-06-02 DIAGNOSIS — L821 Other seborrheic keratosis: Secondary | ICD-10-CM | POA: Diagnosis not present

## 2020-06-02 DIAGNOSIS — L570 Actinic keratosis: Secondary | ICD-10-CM | POA: Diagnosis not present

## 2020-06-02 DIAGNOSIS — X32XXXD Exposure to sunlight, subsequent encounter: Secondary | ICD-10-CM | POA: Diagnosis not present

## 2020-06-02 DIAGNOSIS — D225 Melanocytic nevi of trunk: Secondary | ICD-10-CM | POA: Diagnosis not present

## 2020-06-03 NOTE — Addendum Note (Signed)
Addended by: Louanne Belton, Austyn Seier A on: 06/03/2020 02:18 PM   Modules accepted: Orders

## 2020-06-14 DIAGNOSIS — H401413 Capsular glaucoma with pseudoexfoliation of lens, right eye, severe stage: Secondary | ICD-10-CM | POA: Diagnosis not present

## 2020-06-14 DIAGNOSIS — H401423 Capsular glaucoma with pseudoexfoliation of lens, left eye, severe stage: Secondary | ICD-10-CM | POA: Diagnosis not present

## 2020-07-16 DIAGNOSIS — E039 Hypothyroidism, unspecified: Secondary | ICD-10-CM | POA: Diagnosis not present

## 2020-07-16 DIAGNOSIS — K219 Gastro-esophageal reflux disease without esophagitis: Secondary | ICD-10-CM | POA: Diagnosis not present

## 2020-07-16 DIAGNOSIS — I2510 Atherosclerotic heart disease of native coronary artery without angina pectoris: Secondary | ICD-10-CM | POA: Diagnosis not present

## 2020-07-16 DIAGNOSIS — I2109 ST elevation (STEMI) myocardial infarction involving other coronary artery of anterior wall: Secondary | ICD-10-CM | POA: Diagnosis not present

## 2020-08-17 DIAGNOSIS — E1169 Type 2 diabetes mellitus with other specified complication: Secondary | ICD-10-CM | POA: Diagnosis not present

## 2020-10-26 DIAGNOSIS — K219 Gastro-esophageal reflux disease without esophagitis: Secondary | ICD-10-CM | POA: Diagnosis not present

## 2020-10-26 DIAGNOSIS — I2109 ST elevation (STEMI) myocardial infarction involving other coronary artery of anterior wall: Secondary | ICD-10-CM | POA: Diagnosis not present

## 2020-10-26 DIAGNOSIS — E039 Hypothyroidism, unspecified: Secondary | ICD-10-CM | POA: Diagnosis not present

## 2020-10-26 DIAGNOSIS — I2510 Atherosclerotic heart disease of native coronary artery without angina pectoris: Secondary | ICD-10-CM | POA: Diagnosis not present

## 2020-12-15 DIAGNOSIS — E7800 Pure hypercholesterolemia, unspecified: Secondary | ICD-10-CM | POA: Diagnosis not present

## 2020-12-15 DIAGNOSIS — E1169 Type 2 diabetes mellitus with other specified complication: Secondary | ICD-10-CM | POA: Diagnosis not present

## 2020-12-15 DIAGNOSIS — E039 Hypothyroidism, unspecified: Secondary | ICD-10-CM | POA: Diagnosis not present

## 2020-12-15 DIAGNOSIS — K219 Gastro-esophageal reflux disease without esophagitis: Secondary | ICD-10-CM | POA: Diagnosis not present

## 2020-12-27 DIAGNOSIS — H401413 Capsular glaucoma with pseudoexfoliation of lens, right eye, severe stage: Secondary | ICD-10-CM | POA: Diagnosis not present

## 2020-12-27 DIAGNOSIS — H401423 Capsular glaucoma with pseudoexfoliation of lens, left eye, severe stage: Secondary | ICD-10-CM | POA: Diagnosis not present

## 2021-03-09 DIAGNOSIS — Z0000 Encounter for general adult medical examination without abnormal findings: Secondary | ICD-10-CM | POA: Diagnosis not present

## 2021-03-09 DIAGNOSIS — E538 Deficiency of other specified B group vitamins: Secondary | ICD-10-CM | POA: Diagnosis not present

## 2021-03-09 DIAGNOSIS — K219 Gastro-esophageal reflux disease without esophagitis: Secondary | ICD-10-CM | POA: Diagnosis not present

## 2021-03-09 DIAGNOSIS — E7800 Pure hypercholesterolemia, unspecified: Secondary | ICD-10-CM | POA: Diagnosis not present

## 2021-03-09 DIAGNOSIS — R7303 Prediabetes: Secondary | ICD-10-CM | POA: Diagnosis not present

## 2021-03-09 DIAGNOSIS — E039 Hypothyroidism, unspecified: Secondary | ICD-10-CM | POA: Diagnosis not present

## 2021-04-08 DIAGNOSIS — E039 Hypothyroidism, unspecified: Secondary | ICD-10-CM | POA: Diagnosis not present

## 2021-04-08 DIAGNOSIS — E1169 Type 2 diabetes mellitus with other specified complication: Secondary | ICD-10-CM | POA: Diagnosis not present

## 2021-04-08 DIAGNOSIS — K219 Gastro-esophageal reflux disease without esophagitis: Secondary | ICD-10-CM | POA: Diagnosis not present

## 2021-04-08 DIAGNOSIS — E7800 Pure hypercholesterolemia, unspecified: Secondary | ICD-10-CM | POA: Diagnosis not present

## 2021-06-27 DIAGNOSIS — H401423 Capsular glaucoma with pseudoexfoliation of lens, left eye, severe stage: Secondary | ICD-10-CM | POA: Diagnosis not present

## 2021-06-27 DIAGNOSIS — H401413 Capsular glaucoma with pseudoexfoliation of lens, right eye, severe stage: Secondary | ICD-10-CM | POA: Diagnosis not present

## 2021-06-29 ENCOUNTER — Other Ambulatory Visit: Payer: Self-pay | Admitting: Cardiology

## 2022-01-04 DIAGNOSIS — H401433 Capsular glaucoma with pseudoexfoliation of lens, bilateral, severe stage: Secondary | ICD-10-CM | POA: Diagnosis not present

## 2022-01-16 DIAGNOSIS — H524 Presbyopia: Secondary | ICD-10-CM | POA: Diagnosis not present

## 2022-02-01 DIAGNOSIS — H401423 Capsular glaucoma with pseudoexfoliation of lens, left eye, severe stage: Secondary | ICD-10-CM | POA: Diagnosis not present

## 2022-02-01 DIAGNOSIS — H401413 Capsular glaucoma with pseudoexfoliation of lens, right eye, severe stage: Secondary | ICD-10-CM | POA: Diagnosis not present

## 2022-03-14 DIAGNOSIS — I2510 Atherosclerotic heart disease of native coronary artery without angina pectoris: Secondary | ICD-10-CM | POA: Diagnosis not present

## 2022-03-14 DIAGNOSIS — K219 Gastro-esophageal reflux disease without esophagitis: Secondary | ICD-10-CM | POA: Diagnosis not present

## 2022-03-14 DIAGNOSIS — E7800 Pure hypercholesterolemia, unspecified: Secondary | ICD-10-CM | POA: Diagnosis not present

## 2022-03-14 DIAGNOSIS — E039 Hypothyroidism, unspecified: Secondary | ICD-10-CM | POA: Diagnosis not present

## 2022-03-14 DIAGNOSIS — Z0000 Encounter for general adult medical examination without abnormal findings: Secondary | ICD-10-CM | POA: Diagnosis not present

## 2022-03-14 DIAGNOSIS — E1169 Type 2 diabetes mellitus with other specified complication: Secondary | ICD-10-CM | POA: Diagnosis not present

## 2022-08-03 DIAGNOSIS — H401433 Capsular glaucoma with pseudoexfoliation of lens, bilateral, severe stage: Secondary | ICD-10-CM | POA: Diagnosis not present

## 2022-09-05 DIAGNOSIS — K080 Exfoliation of teeth due to systemic causes: Secondary | ICD-10-CM | POA: Diagnosis not present

## 2022-09-26 DIAGNOSIS — E039 Hypothyroidism, unspecified: Secondary | ICD-10-CM | POA: Diagnosis not present

## 2023-01-25 DIAGNOSIS — K080 Exfoliation of teeth due to systemic causes: Secondary | ICD-10-CM | POA: Diagnosis not present

## 2023-03-19 DIAGNOSIS — E7800 Pure hypercholesterolemia, unspecified: Secondary | ICD-10-CM | POA: Diagnosis not present

## 2023-03-19 DIAGNOSIS — E1169 Type 2 diabetes mellitus with other specified complication: Secondary | ICD-10-CM | POA: Diagnosis not present

## 2023-03-19 DIAGNOSIS — Z0000 Encounter for general adult medical examination without abnormal findings: Secondary | ICD-10-CM | POA: Diagnosis not present

## 2023-03-19 DIAGNOSIS — E039 Hypothyroidism, unspecified: Secondary | ICD-10-CM | POA: Diagnosis not present

## 2023-04-18 DIAGNOSIS — D72829 Elevated white blood cell count, unspecified: Secondary | ICD-10-CM | POA: Diagnosis not present

## 2023-04-20 ENCOUNTER — Encounter (HOSPITAL_BASED_OUTPATIENT_CLINIC_OR_DEPARTMENT_OTHER): Payer: Self-pay | Admitting: Cardiology

## 2023-04-20 ENCOUNTER — Ambulatory Visit (HOSPITAL_BASED_OUTPATIENT_CLINIC_OR_DEPARTMENT_OTHER): Payer: Medicare Other | Admitting: Cardiology

## 2023-04-20 VITALS — BP 144/72 | HR 100 | Ht 67.0 in | Wt 210.6 lb

## 2023-04-20 DIAGNOSIS — E7800 Pure hypercholesterolemia, unspecified: Secondary | ICD-10-CM | POA: Diagnosis not present

## 2023-04-20 DIAGNOSIS — I252 Old myocardial infarction: Secondary | ICD-10-CM | POA: Diagnosis not present

## 2023-04-20 DIAGNOSIS — I2510 Atherosclerotic heart disease of native coronary artery without angina pectoris: Secondary | ICD-10-CM

## 2023-04-20 NOTE — Progress Notes (Signed)
 Cardiology Office Note:  .   Date:  04/20/2023  ID:  [name], DOB [birth year], MRN [id number] PCP: Wonda Worth SQUIBB, PA  [facility] Providers Cardiologist:  Oneil Parchment, MD     History of Present Illness: .   [name] is a 83 y.o. male Discussed with the use of AI scribe   History of Present Illness   The patient is an 83 year old male with a history of coronary artery disease, who had a cardiac catheterization and a Taxus 3x47mm RCA stent placement in 2004 (Dr. Malva). At that time, the patient experienced anginal symptoms characterized by centralized chest discomfort. The patient also had an anterolateral infarct with an occluded diagonal. The patient has been on aspirin monotherapy and has not experienced any difficulties over the years. The patient is also on Crestor  20mg , omega-3, and CoQ10. The patient's LDL was 97 with a goal of less than 70. The patient follows a Mediterranean diet and has never smoked.  The patient's creatinine in 2022 was 0.8, hemoglobin was 13.4, platelets were 276, and HbA1c was 6.8. The patient's potassium was 4.4, ALT was 27, TSH was 3.2, LDL was 93, and triglycerides were 97.  The patient reports feeling short of breath, particularly in cold weather. The patient has been active, doing yard work until the cold weather set in. The patient also reports needing to rest frequently during physical activity, such as using a backpack blower for 30 minutes to an hour. The patient denies any chest pain or discomfort.  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 The patient had a stomach virus before Thanksgiving but has not had any other illnesses recently. The patient has never had a flu shot but has had a pneumonia shot and a shingles shot in the past. The patient has a family history of heart disease.          ROS: No  CP  Studies Reviewed: SABRA   EKG Interpretation Date/Time:  Friday April 20 2023 13:50:46 EST Ventricular Rate:  90 PR Interval:  192 QRS Duration:  78 QT Interval:  354 QTC Calculation: 433 R Axis:   8  Text Interpretation: Normal sinus rhythm Minimal voltage criteria for LVH, may be normal variant ( R in aVL ) When compared with ECG of 25-Jul-2002 04:01, No significant change was found Confirmed by Parchment Oneil ([id number]) on 04/20/2023 1:54:36 PM    Results   LABS Creatinine: 0.78 mg/dL Hb: 86.5 g/dL (7977) PLT: 723 k89^0/O (2022) HbA1c: 6.8% (2022) K: 4.4 mmol/L ALT: 27 U/L TSH: 3.2 mIU/L LDL: 93 mg/dL Triglycerides: 97 mg/dL  DIAGNOSTIC EKG: Normal     Risk Assessment/Calculations:           Physical Exam:   VS:  BP (!) 144/72   Pulse 100   Ht 5' 7 (1.702 m)   Wt 210 lb 9.6 oz (95.5 kg)   SpO2 96%   BMI 32.98 kg/m    Wt Readings from Last 3 Encounters:  04/20/23 210 lb 9.6 oz (95.5 kg)  05/25/20 169 lb (76.7 kg)  05/30/19 209 lb (94.8 kg)    GEN: Well nourished, well developed in no acute distress NECK: No JVD; No carotid bruits CARDIAC: RRR, no murmurs, no rubs, no gallops RESPIRATORY:  Clear to auscultation without rales, wheezing or rhonchi  ABDOMEN: Soft, non-tender, non-distended EXTREMITIES:  No  edema; No deformity   ASSESSMENT AND PLAN: .    Assessment and Plan    Coronary Artery Disease (CAD) Follow-up for CAD. History of RCA stent placement in 2004 with anterolateral myocardial infarction and occluded diagonal. Currently asymptomatic with no anginal symptoms. Continues on aspirin monotherapy despite previous discussions about clopidogrel for early generation drug-eluting stents to prevent late stent thrombosis. Prefers aspirin only. LDL goal <70, currently 93. No recent chest pain, but reports shortness of breath, especially in cold weather. EKG shows no changes. Discussed importance of regular physical activity to improve conditioning and prevent  deconditioning. - Continue aspirin monotherapy - Continue Crestor  20 mg at bedtime - Continue omega-3 and CoQ10 - Encourage Mediterranean diet - Encourage regular physical activity - Consider echocardiogram if shortness of breath worsens  General Health Maintenance Received pneumonia and shingles vaccinations. No flu vaccination. Reports strong immune system and regular intake of vitamins B12, C, D, and zinc. - Continue current vitamin regimen - Encourage annual flu vaccination - Schedule follow-up in one year  Follow-up - Schedule follow-up appointment in one year - Ensure recall is set in the system to notify for next appointment.              Signed, Oneil Parchment, MD

## 2023-04-20 NOTE — Patient Instructions (Signed)
 Medication Instructions:  Your physician recommends that you continue on your current medications as directed. Please refer to the Current Medication list given to you today.   *If you need a refill on your cardiac medications before your next appointment, please call your pharmacy*  Lab Work: none  Testing/Procedures: none  Follow-Up: At [facility], you and your health needs are our priority.  As part of our continuing mission to provide you with exceptional heart care, we have created designated Provider Care Teams.  These Care Teams include your primary Cardiologist (physician) and Advanced Practice Providers (APPs -  Physician Assistants and Nurse Practitioners) who all work together to provide you with the care you need, when you need it.  We recommend signing up for the patient portal called MyChart.  Sign up information is provided on this After Visit Summary.  MyChart is used to connect with patients for Virtual Visits (Telemedicine).  Patients are able to view lab/test results, encounter notes, upcoming appointments, etc.  Non-urgent messages can be sent to your provider as well.   To learn more about what you can do with MyChart, go to [email or web address].    Your next appointment:   12 month(s)  Provider:   Dr Jeffrie

## 2023-07-05 DIAGNOSIS — H401433 Capsular glaucoma with pseudoexfoliation of lens, bilateral, severe stage: Secondary | ICD-10-CM | POA: Diagnosis not present

## 2023-08-16 DIAGNOSIS — D485 Neoplasm of uncertain behavior of skin: Secondary | ICD-10-CM | POA: Diagnosis not present

## 2023-08-16 DIAGNOSIS — H0279 Other degenerative disorders of eyelid and periocular area: Secondary | ICD-10-CM | POA: Diagnosis not present

## 2023-11-09 ENCOUNTER — Telehealth: Payer: Self-pay

## 2023-11-09 NOTE — Telephone Encounter (Signed)
   Pre-operative Risk Assessment    Patient Name: [name]  DOB: May 04, 1940 MRN: [id number]   Date of last office visit: 04/20/23 ONEIL PARCHMENT, MD Date of next office visit: NONE   Request for Surgical Clearance    Procedure:  RIGHT UPPER EYELID, RIGHT OUTER LID/LATERAL EYELID, LEFT UPPER EYELID/MEDIAL CANTHUS LESION EXCISION AND REPAIR WITH BIOPSY  Date of Surgery:  Clearance 11/29/23                                Surgeon:  DR REFUGIO NANNY Surgeon's Group or Practice Name:  [facility] Phone number:  [phone] Fax number:  [phone]   Type of Clearance Requested:   - Medical  - Pharmacy:  Hold Aspirin     Type of Anesthesia:  Local    Additional requests/questions:    SignedLucie DELENA Ku   11/09/2023, 5:32 PM

## 2023-11-12 NOTE — Telephone Encounter (Signed)
   Name: [name]  DOB: 1941-03-28  MRN: [id number]  Primary Cardiologist: Oneil Parchment, MD   Preoperative team, please contact this patient and set up a phone call appointment for further preoperative risk assessment. Please obtain consent and complete medication review. Thank you for your help.  I confirm that guidance regarding antiplatelet and oral anticoagulation therapy has been completed and, if necessary, noted below.  Regarding ASA therapy, we recommend continuation of ASA throughout the perioperative period. However, if the surgeon feels that cessation of ASA is required in the perioperative period, it may be stopped 5-7 days prior to surgery with a plan to resume it as soon as felt to be feasible from a surgical standpoint in the post-operative period.    I also confirmed the patient resides in the state of [place] . As per [facility] Medical Board telemedicine laws, the patient must reside in the state in which the provider is licensed.   Josefa CHRISTELLA Beauvais, NP 11/12/2023, 8:25 AM [facility]

## 2023-11-12 NOTE — Telephone Encounter (Signed)
 Patient has been scheduled for a telephone visit on 11/19/23 with Katlyn West, NP for pre-op clearance.

## 2023-11-19 ENCOUNTER — Ambulatory Visit: Attending: Cardiovascular Disease

## 2023-11-19 DIAGNOSIS — Z01810 Encounter for preprocedural cardiovascular examination: Secondary | ICD-10-CM | POA: Diagnosis not present

## 2023-11-19 NOTE — Progress Notes (Signed)
 Virtual Visit via Telephone Note   Because of [name] co-morbid illnesses, he is at least at moderate risk for complications without adequate follow up.  This format is felt to be most appropriate for this patient at this time.  Due to technical limitations with video connection (technology), today's appointment will be conducted as an audio only telehealth visit, and DEMARRI ELIE verbally agreed to proceed in this manner.   All issues noted in this document were discussed and addressed.  No physical exam could be performed with this format.  Evaluation Performed:  Preoperative cardiovascular risk assessment _____________   Date:  11/19/2023   Patient ID:  [name], DOB 12/27/1940, MRN [id number] Patient Location:  Home Provider location:   Office  Primary Care Provider:  Wonda Worth SQUIBB, PA Primary Cardiologist:  Oneil Parchment, MD  Chief Complaint / Patient Profile  83 y.o. y/o male with a h/o coronary artery disease with stent placement to the RCA in 2004, hyperlipidemia, who is pending right upper eyelid, right outer lid/lateral eyelid, left upper eyelid/medial canthus lesion excision and repair with biopsy on 11/29/2023 with Dr. Refugio Nanny and presents today for telephonic preoperative cardiovascular risk assessment. History of Present Illness  [name] is a 83 y.o. male who presents via audio/video conferencing for a telehealth visit today.  Pt was last seen in cardiology clinic on 04/20/2023 by Dr. Parchment.  At that time [name] was doing well.  The patient is now pending procedure as outlined above. Since his last visit, he has remained stable from a cardiac standpoint.  Today he denies chest pain, shortness of breath, lower extremity edema, fatigue, palpitations, melena, hematuria, hemoptysis, diaphoresis, weakness, presyncope, syncope, orthopnea, and PND.  Patient reports that he has been doing very well overall without any changes since his last  office visit.  He is able to achieve greater than 4 METS of activity with ADLs, walking, climbing stairs and yard work. 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 One month later, RCA PCI.   Ptosis    Vertical diplopia    Past Surgical History:  Procedure Laterality Date   CYSTECTOMY      Allergies  No Known Allergies  Home Medications    Prior to Admission medications   Medication Sig Start Date End Date Taking? Authorizing Provider  aspirin 81 MG tablet Take 81 mg by mouth daily.    [provider]  Coenzyme Q10 (CO Q10) 200 MG CAPS Take 200 mg by mouth daily.    [provider]  fexofenadine (ALLEGRA) 180 MG tablet Take 180 mg by mouth daily.    [provider]  fish oil-omega-3 fatty acids 1000 MG capsule Take 2 g by mouth daily. Patient not taking: Reported on 11/12/2023    [provider]  ibuprofen (ADVIL,MOTRIN) 200 MG tablet Take 200 mg by mouth every 6 (six) hours as needed.    [provider]  ketoconazole  (NIZORAL ) 2 % cream Apply 1 application topically 2 (two) times daily. 05/30/18   Parchment Oneil BROCKS, MD  levothyroxine (SYNTHROID) 75 MCG tablet Take 75 mcg by mouth daily. 05/24/20   [provider]  naproxen (NAPROSYN) 500 MG tablet Take 500 mg by mouth once as needed. Patient not taking: Reported on 11/12/2023    [provider]  nitroGLYCERIN  (NITROSTAT ) 0.4 MG SL tablet DISSOLVE 1 TABLET UNDER THE TONGUE EVERY 5 MINUTES FOR UP TO 3 DOSES AS NEEDED FOR CHEST PAIN. IF NO RELIEF AFTER 3 DOSES, CALL 911 OR GO TO ER. 06/29/21   Jeffrie Oneil BROCKS, MD  omeprazole (PRILOSEC) 20 MG capsule Take 20 mg by mouth daily.    [provider]  rosuvastatin  (CRESTOR ) 20 MG tablet TAKE 1 TABLET BY MOUTH DAILY 07/03/19   Jeffrie Oneil BROCKS, MD  Zinc 50 MG TABS 1 tablet Orally Once a day; Duration: 30 day(s)    [provider]    Physical Exam  Vital Signs:  Sean Malinowski Reiber does not have vital signs available for review today. Given telephonic nature of communication, physical exam is limited. AAOx3. NAD. Normal affect.  Speech and respirations are unlabored. Accessory Clinical Findings  None Assessment & Plan    1.  Preoperative Cardiovascular Risk Assessment: Mr. Arneson perioperative risk of a major cardiac event is 0.9% according to the Revised Cardiac Risk Index (RCRI).  Therefore, he is at low risk for perioperative complications.   His functional capacity is good at 6.36 METs according to the Duke Activity Status Index (DASI). Recommendations: According to ACC/AHA guidelines, no further cardiovascular testing needed.  The patient may proceed to surgery at acceptable risk.   Antiplatelet and/or Anticoagulation Recommendations: Regarding ASA therapy, we recommend continuation of ASA throughout the perioperative period. However, if the surgeon feels that cessation of ASA is required in the perioperative period, it may be stopped 5-7 days prior to surgery with a plan to resume it as soon as felt to be feasible from a surgical standpoint in the post-operative period.   The patient was advised that if he develops new symptoms prior to surgery to contact our office to arrange for a follow-up visit, and he verbalized understanding. A copy of this note will be routed to requesting surgeon.  Time:   Today, I have spent 10 minutes with the patient with telehealth technology discussing medical history, symptoms, and management plan.    Aneira Cavitt D Tanzania Basham, NP  11/19/2023, 9:37 AM

## 2023-11-21 DIAGNOSIS — L570 Actinic keratosis: Secondary | ICD-10-CM | POA: Diagnosis not present

## 2023-11-21 DIAGNOSIS — L720 Epidermal cyst: Secondary | ICD-10-CM | POA: Diagnosis not present

## 2023-11-21 DIAGNOSIS — D225 Melanocytic nevi of trunk: Secondary | ICD-10-CM | POA: Diagnosis not present

## 2023-11-21 DIAGNOSIS — X32XXXA Exposure to sunlight, initial encounter: Secondary | ICD-10-CM | POA: Diagnosis not present

## 2023-11-21 DIAGNOSIS — D2339 Other benign neoplasm of skin of other parts of face: Secondary | ICD-10-CM | POA: Diagnosis not present

## 2023-11-29 DIAGNOSIS — D485 Neoplasm of uncertain behavior of skin: Secondary | ICD-10-CM | POA: Diagnosis not present

## 2023-11-29 DIAGNOSIS — L820 Inflamed seborrheic keratosis: Secondary | ICD-10-CM | POA: Diagnosis not present

## 2023-12-05 DIAGNOSIS — H401423 Capsular glaucoma with pseudoexfoliation of lens, left eye, severe stage: Secondary | ICD-10-CM | POA: Diagnosis not present

## 2023-12-05 DIAGNOSIS — H401413 Capsular glaucoma with pseudoexfoliation of lens, right eye, severe stage: Secondary | ICD-10-CM | POA: Diagnosis not present

## 2023-12-27 DIAGNOSIS — D23121 Other benign neoplasm of skin of left upper eyelid, including canthus: Secondary | ICD-10-CM | POA: Diagnosis not present

## 2023-12-27 DIAGNOSIS — D23111 Other benign neoplasm of skin of right upper eyelid, including canthus: Secondary | ICD-10-CM | POA: Diagnosis not present

## 2023-12-27 DIAGNOSIS — H0279 Other degenerative disorders of eyelid and periocular area: Secondary | ICD-10-CM | POA: Diagnosis not present

## 2024-02-05 DIAGNOSIS — K080 Exfoliation of teeth due to systemic causes: Secondary | ICD-10-CM | POA: Diagnosis not present

## 2024-02-20 DIAGNOSIS — K080 Exfoliation of teeth due to systemic causes: Secondary | ICD-10-CM | POA: Diagnosis not present

## 2024-03-26 DIAGNOSIS — E039 Hypothyroidism, unspecified: Secondary | ICD-10-CM | POA: Diagnosis not present

## 2024-03-26 DIAGNOSIS — E1169 Type 2 diabetes mellitus with other specified complication: Secondary | ICD-10-CM | POA: Diagnosis not present

## 2024-03-26 DIAGNOSIS — E7800 Pure hypercholesterolemia, unspecified: Secondary | ICD-10-CM | POA: Diagnosis not present
